# Patient Record
Sex: Male | Born: 1960 | Race: Black or African American | Hispanic: No | Marital: Single | Smoking: Former smoker
Health system: Southern US, Community
[De-identification: ages and names within clinical notes are randomized; demographics above are authoritative.]

## PROBLEM LIST (undated history)

## (undated) ENCOUNTER — Emergency Department (HOSPITAL_BASED_OUTPATIENT_CLINIC_OR_DEPARTMENT_OTHER): Admission: EM | Payer: Self-pay | Source: Home / Self Care

## (undated) DIAGNOSIS — F1491 Cocaine use, unspecified, in remission: Secondary | ICD-10-CM

## (undated) DIAGNOSIS — K08109 Complete loss of teeth, unspecified cause, unspecified class: Secondary | ICD-10-CM

## (undated) DIAGNOSIS — N401 Enlarged prostate with lower urinary tract symptoms: Secondary | ICD-10-CM

## (undated) DIAGNOSIS — C61 Malignant neoplasm of prostate: Secondary | ICD-10-CM

## (undated) DIAGNOSIS — Z87898 Personal history of other specified conditions: Secondary | ICD-10-CM

## (undated) DIAGNOSIS — I10 Essential (primary) hypertension: Secondary | ICD-10-CM

## (undated) HISTORY — PX: SATURATION BIOPSY OF PROSTATE: SHX2375

## (undated) HISTORY — PX: VASECTOMY: SHX75

## (undated) HISTORY — PX: TONSILLECTOMY: SUR1361

---

## 2018-04-27 ENCOUNTER — Ambulatory Visit (INDEPENDENT_AMBULATORY_CARE_PROVIDER_SITE_OTHER): Payer: Medicare HMO

## 2018-04-27 ENCOUNTER — Ambulatory Visit (HOSPITAL_COMMUNITY)
Admission: EM | Admit: 2018-04-27 | Discharge: 2018-04-27 | Disposition: A | Discharge status: Home / Self Care | Payer: Medicare HMO | Attending: Physician Assistant | Admitting: Physician Assistant

## 2018-04-27 ENCOUNTER — Encounter (HOSPITAL_COMMUNITY): Payer: Self-pay | Admitting: Emergency Medicine

## 2018-04-27 DIAGNOSIS — R0789 Other chest pain: Secondary | ICD-10-CM

## 2018-04-27 DIAGNOSIS — R0781 Pleurodynia: Secondary | ICD-10-CM | POA: Diagnosis not present

## 2018-04-27 HISTORY — DX: Essential (primary) hypertension: I10

## 2018-04-27 MED ORDER — IBUPROFEN 600 MG PO TABS: 600.0000 mg | ORAL_TABLET | Freq: Four times a day (QID) | ORAL | 0 refills | Status: DC | PRN

## 2018-04-27 NOTE — ED Provider Notes (Signed)
[facility]    CSN: [id number] Arrival date & time: 04/27/18  1127     History   Chief Complaint Chief Complaint  Patient presents with  . Rib Injury    HPI Jeffrey Santiago is a 57 y.o. male.   HPI  Jeffrey Santiago is a 57 y.o. male presenting to UC with c/o Left rib pain that started this morning after call onto his tub. Pain is sharp and sore, worse with certain movements and deep breathing. He has taken Norco, which is prescribed for his back, but only mild relief. Denies hitting his head or LOC.    Past Medical History:  Diagnosis Date  . Hypertension     There are no active problems to display for this patient.   Past Surgical History:  Procedure Laterality Date  . TONSILLECTOMY    . VASECTOMY         Home Medications    Prior to Admission medications   Medication Sig Start Date End Date Taking? Authorizing Provider  ibuprofen (ADVIL,MOTRIN) 600 MG tablet Take 1 tablet (600 mg total) by mouth every 6 (six) hours as needed. 04/27/18   Ok Edwards, PA-C    Family History Family History  Problem Relation Age of Onset  . Diabetes Mother     Social History Social History   Tobacco Use  . Smoking status: Current Some Day Smoker  Substance Use Topics  . Alcohol use: Not Currently  . Drug use: Never     Allergies   Patient has no known allergies.   Review of Systems Review of Systems  Respiratory: Negative for chest tightness and shortness of breath.   Cardiovascular: Positive for chest pain (Left rib). Negative for palpitations.     Physical Exam Triage Vital Signs ED Triage Vitals [04/27/18 1216]  Enc Vitals Group     BP (!) 155/97     Pulse Rate 72     Resp 16     Temp 98.3 F (36.8 C)     Temp src      SpO2 100 %     Weight      Height      Head Circumference      Peak Flow      Pain Score 8     Pain Loc      Pain Edu?      Excl. in [place]?    No data found.  Updated Vital Signs BP (!) 155/97   Pulse 72   Temp 98.3  F (36.8 C)   Resp 16   SpO2 100%   Visual Acuity Right Eye Distance:   Left Eye Distance:   Bilateral Distance:    Right Eye Near:   Left Eye Near:    Bilateral Near:     Physical Exam  Constitutional: He is oriented to person, place, and time. He appears well-developed and well-nourished.  HENT:  Head: Normocephalic and atraumatic.  Mouth/Throat: Oropharynx is clear and moist.  Eyes: EOM are normal.  Neck: Normal range of motion.  Cardiovascular: Normal rate and regular rhythm.  Pulmonary/Chest: Effort normal and breath sounds normal. No stridor. No respiratory distress. He has no wheezes. He has no rales. He exhibits tenderness.  Musculoskeletal: Normal range of motion.  Neurological: He is alert and oriented to person, place, and time.  Skin: Skin is warm and dry.  Psychiatric: He has a normal mood and affect. His behavior is normal.  Nursing note and vitals reviewed.  UC Treatments / Results  Labs (all labs ordered are listed, but only abnormal results are displayed) Labs Reviewed - No data to display  EKG None  Radiology Dg Ribs Unilateral W/chest Left  Result Date: 04/27/2018 CLINICAL DATA:  Fall in bathtub this morning. Left rib and chest pain. Initial encounter. EXAM: LEFT RIBS AND CHEST - 3+ VIEW COMPARISON:  None. FINDINGS: No fracture or other bone lesions are seen involving the ribs. There is no evidence of pneumothorax or hemothorax. Linear opacities in both lung bases may be due to mild scarring or atelectasis. No evidence of pulmonary airspace disease. Heart size and mediastinal contours are within normal limits. IMPRESSION: No left rib fractures identified. Mild bibasilar atelectasis versus scarring. Electronically Signed   By: Earle Gell M.D.   On: 04/27/2018 13:02    Procedures Procedures (including critical care time)  Medications Ordered in UC Medications - No data to display  Initial Impression / Assessment and Plan / UC Course  I have  reviewed the triage vital signs and the nursing notes.  Pertinent labs & imaging results that were available during my care of the patient were reviewed by me and considered in my medical decision making (see chart for details).      Reassured pt normal CXR May take ibuprofen with his previously prescribed norco.    Final Clinical Impressions(s) / UC Diagnoses   Final diagnoses:  Left-sided chest wall pain     Discharge Instructions      You may take 500mg  acetaminophen every 4-6 hours or in combination with ibuprofen 400-600mg  every 6-8 hours as needed for pain and inflammation.   You may also alternate cool and warm compresses for comfort.   Please follow up with your family doctor in 1 week if not improving.     ED Prescriptions    Medication Sig Dispense Auth. Provider   ibuprofen (ADVIL,MOTRIN) 600 MG tablet Take 1 tablet (600 mg total) by mouth every 6 (six) hours as needed. 30 tablet Ok Edwards, PA-C     Controlled Substance Prescriptions [place] Controlled Substance Registry consulted? Not Applicable   Tyrell Antonio 04/27/18 1713

## 2018-04-27 NOTE — ED Triage Notes (Signed)
Pt states he hit his L rib cage on the side of the tub this morning. C/o L rib cage pain and tenderness.

## 2018-04-27 NOTE — Discharge Instructions (Addendum)
°  You may take 500mg  acetaminophen every 4-6 hours or in combination with ibuprofen 400-600mg  every 6-8 hours as needed for pain and inflammation.   You may also alternate cool and warm compresses for comfort.   Please follow up with your family doctor in 1 week if not improving.

## 2019-04-28 ENCOUNTER — Other Ambulatory Visit: Payer: Self-pay

## 2019-04-28 DIAGNOSIS — Z20822 Contact with and (suspected) exposure to covid-19: Secondary | ICD-10-CM

## 2019-04-29 LAB — NOVEL CORONAVIRUS, NAA: SARS-CoV-2, NAA: NOT DETECTED

## 2019-06-26 DIAGNOSIS — R3129 Other microscopic hematuria: Secondary | ICD-10-CM | POA: Insufficient documentation

## 2019-06-26 DIAGNOSIS — R972 Elevated prostate specific antigen [PSA]: Secondary | ICD-10-CM | POA: Insufficient documentation

## 2019-08-07 DIAGNOSIS — C61 Malignant neoplasm of prostate: Secondary | ICD-10-CM | POA: Insufficient documentation

## 2019-08-17 ENCOUNTER — Encounter: Payer: Self-pay | Admitting: *Deleted

## 2019-08-21 ENCOUNTER — Encounter: Payer: Self-pay | Admitting: *Deleted

## 2019-08-24 DIAGNOSIS — N529 Male erectile dysfunction, unspecified: Secondary | ICD-10-CM | POA: Insufficient documentation

## 2019-08-31 NOTE — Progress Notes (Signed)
GU Location of Tumor / Histology: prostatic adenocarcinoma  If Prostate Cancer, Gleason Score is (3 + 4) and PSA is (6.78). Prostate volume: 36 cc  Jeffrey Santiago was noted to have an elevated PSA during his insurance physical months ago without notice.  Biopsies of prostate (if applicable) revealed:        Past/Anticipated interventions by urology, if any: prostate biopsy, ADT, encouraged to stop testosterone ssupplementation, referral for consideration of radiotherapy closer to home.  Past/Anticipated interventions by medical oncology, if any: no  Weight changes, if any: denies  Bowel/Bladder complaints, if any: IPSS 1. SHIM 25. Nocturia x 1. Taking Flomax. Denies any bowel complaints.  Nausea/Vomiting, if any: denies  Pain issues, if any:  denies  SAFETY ISSUES:  Prior radiation? no  Pacemaker/ICD? no  Possible current pregnancy? no, male patient  Is the patient on methotrexate? no  Current Complaints / other details:  59 year old male. Divorced. Father with hx of prostate and colon cancer. Reports he quit smoking but can't remember when or how many ppd he smoked.

## 2019-09-01 ENCOUNTER — Ambulatory Visit
Admission: RE | Admit: 2019-09-01 | Discharge: 2019-09-01 | Disposition: A | Discharge status: Home / Self Care | Payer: Medicare HMO | Source: Ambulatory Visit | Attending: Radiation Oncology | Admitting: Radiation Oncology

## 2019-09-01 ENCOUNTER — Encounter: Payer: Self-pay | Admitting: Radiation Oncology

## 2019-09-01 ENCOUNTER — Other Ambulatory Visit: Payer: Self-pay

## 2019-09-01 VITALS — Ht 71.0 in | Wt 225.0 lb

## 2019-09-01 DIAGNOSIS — C61 Malignant neoplasm of prostate: Secondary | ICD-10-CM

## 2019-09-01 HISTORY — DX: Malignant neoplasm of prostate: C61

## 2019-09-01 NOTE — Progress Notes (Signed)
See progress notes under physician encounter. 

## 2019-09-01 NOTE — Progress Notes (Signed)
Radiation Oncology         [phone]) [phone] ________________________________  Initial Outpatient Consultation - Conducted via Telephone due to current COVID-19 concerns for limiting patient exposure  Name: Mavryk Pino MRN: [id number]  Date: 09/01/2019  DOB: 01/07/1961  QM:VHQIONGE, [name], FNP  Thea Silversmith, MD   REFERRING PHYSICIAN: Thea Silversmith, MD  DIAGNOSIS: 59 y.o. gentleman with Stage T1c adenocarcinoma of the prostate with Gleason score of 3+4, and PSA of 10.17.    ICD-10-CM   1. Malignant neoplasm of prostate ([place])  C61     HISTORY OF PRESENT ILLNESS: Salvator Sturdivant is a 59 y.o. male with a diagnosis of prostate cancer. He was noted to have an elevated PSA of 6.78 in 07/2018 by his primary care physician, Dr. Carney Bern.  Accordingly, he was referred for evaluation in urology by Dr. Venia Minks on 03/19/2019,  digital rectal examination was performed at that time revealing no nodules.  Repeat PSA performed that day was 10.17.  A 4K score was obtained and showed an 11%, or moderate risk for aggressive prostate cancer.  The patient proceeded to transrectal ultrasound with 12 biopsies of the prostate under anesthesia on 07/22/2019.  The prostate volume measured 36 cc.  Out of 12 core biopsies, all 12 were positive.  The maximum Gleason score was 3+4, and this was seen in all 6 of the left lobe biopsies as well as in the right base, right lateral apex, right mid lateral and right base lateral.  Additionally, Gleason 3+3 was seen in the right mid and right apex with perineural invasion noted in 8 of the cores.   He met with Dr. Pablo Ledger on 08/07/2019 to discuss radiation therapy, and with Dr. Nevada Crane on 08/12/2019 to discuss prostatectomy. He was also referred for genetic counseling with [facility] on 08/25/2019.  He is adamantly not interested in proceeding with surgical prostatectomy and indicated that he was most interested in proceeding with ST-ADT concurrent with a seed boost followed by 5 weeks of daily  radiotherapy. He received a 6 month leuprolide (Lupron) injection on 08/24/2019.  After meeting with Dr. Pablo Ledger, he indicated that he would prefer to have his radiation treatments in [place], closer to his home, and he has kindly been referred today for discussion of potential radiation treatment options locally.   PREVIOUS RADIATION THERAPY: No  PAST MEDICAL HISTORY:  Past Medical History:  Diagnosis Date  . Hypertension   . Prostate cancer ([place])       PAST SURGICAL HISTORY: Past Surgical History:  Procedure Laterality Date  . TONSILLECTOMY    . VASECTOMY      FAMILY HISTORY:  Family History  Problem Relation Age of Onset  . Diabetes Mother   . Prostate cancer Father   . Colon cancer Father   . Breast cancer Neg Hx   . Pancreatic cancer Neg Hx     SOCIAL HISTORY:  Social History   Socioeconomic History  . Marital status: Single    Spouse name: Not on file  . Number of children: Not on file  . Years of education: Not on file  . Highest education level: Not on file  Occupational History  . Not on file  Tobacco Use  . Smoking status: Former Smoker    Types: Cigarettes  . Smokeless tobacco: Never Used  . Tobacco comment: Patient not interested in discussing. Reports he can't remember when he stopped smoking nor how many packs per day.  Substance and Sexual Activity  . Alcohol use: Not Currently  .  Drug use: Never  . Sexual activity: Yes  Other Topics Concern  . Not on file  Social History Narrative  . Not on file   Social Determinants of Health   Financial Resource Strain:   . Difficulty of Paying Living Expenses:   Food Insecurity:   . Worried About Charity fundraiser in the Last Year:   . Arboriculturist in the Last Year:   Transportation Needs:   . Film/video editor (Medical):   Marland Kitchen Lack of Transportation (Non-Medical):   Physical Activity:   . Days of Exercise per Week:   . Minutes of Exercise per Session:   Stress:   . Feeling of Stress  :   Social Connections:   . Frequency of Communication with Friends and Family:   . Frequency of Social Gatherings with Friends and Family:   . Attends Religious Services:   . Active Member of Clubs or Organizations:   . Attends Archivist Meetings:   Marland Kitchen Marital Status:   Intimate Partner Violence:   . Fear of Current or Ex-Partner:   . Emotionally Abused:   Marland Kitchen Physically Abused:   . Sexually Abused:     ALLERGIES: Patient has no known allergies.  MEDICATIONS:  Current Outpatient Medications  Medication Sig Dispense Refill  . amLODipine (NORVASC) 10 MG tablet Take by mouth.    . olmesartan (BENICAR) 40 MG tablet TAKE 1 TABLET BY MOUTH DAILY    . sildenafil (VIAGRA) 100 MG tablet Take 1/2 to 1 tablet as needed.  Do not use if taking nitrates for heart disease.    . tamsulosin (FLOMAX) 0.4 MG CAPS capsule TAKE 1 CAPSULE BY MOUTH DAILY     No current facility-administered medications for this encounter.    REVIEW OF SYSTEMS:  On review of systems, the patient reports that he is doing well overall. He denies any chest pain, shortness of breath, cough, fevers, chills, night sweats, unintended weight changes. He denies any bowel disturbances, and denies abdominal pain, nausea or vomiting. He denies any new musculoskeletal or joint aches or pains. His IPSS was 1, indicating minimal urinary symptoms. He endorses taking flomax and only reports nocturia x1. His SHIM was 25, indicating he does not have erectile dysfunction. A complete review of systems is obtained and is otherwise negative.    PHYSICAL EXAM:  Wt Readings from Last 3 Encounters:  09/01/19 225 lb (102.1 kg)   Temp Readings from Last 3 Encounters:  04/27/18 98.3 F (36.8 C)   BP Readings from Last 3 Encounters:  04/27/18 (!) 155/97   Pulse Readings from Last 3 Encounters:  04/27/18 72   Pain Assessment Pain Score: 0-No pain/10  Physical exam not performed in light of telephone consult visit format.   KPS  = 90  100 - Normal; no complaints; no evidence of disease. 90   - Able to carry on normal activity; minor signs or symptoms of disease. 80   - Normal activity with effort; some signs or symptoms of disease. 17   - Cares for self; unable to carry on normal activity or to do active work. 60   - Requires occasional assistance, but is able to care for most of his personal needs. 50   - Requires considerable assistance and frequent medical care. 18   - Disabled; requires special care and assistance. 84   - Severely disabled; hospital admission is indicated although death not imminent. 68   - Very sick; hospital admission necessary;  active supportive treatment necessary. 10   - Moribund; fatal processes progressing rapidly. 0     - Dead  Karnofsky DA, Abelmann WH, Craver LS and Burchenal JH [phone]) The use of the nitrogen mustards in the palliative treatment of carcinoma: with particular reference to bronchogenic carcinoma Cancer 1 634-56  LABORATORY DATA:  No results found for: WBC, HGB, HCT, MCV, PLT No results found for: NA, K, CL, CO2 No results found for: ALT, AST, GGT, ALKPHOS, BILITOT   RADIOGRAPHY: No results found.    IMPRESSION/PLAN: This visit was conducted via Telephone to spare the patient unnecessary potential exposure in the healthcare setting during the current COVID-19 pandemic. 1. 59 y.o. gentleman with Stage T1c adenocarcinoma of the prostate with Gleason score of 3+4, and PSA of 10.17.  We discussed the patient's workup and outlined the nature of prostate cancer in this setting. The patient's T stage, Gleason's score, and PSA put him into the intermediate risk group. Accordingly, he is eligible for a variety of potential treatment options including prostatectomy or ST-ADT in combination with either 8 weeks of external radiation or 5 weeks of external radiation preceded by a brachytherapy boost. We discussed the available radiation techniques, and focused on the details and  logistics of delivery. We discussed and outlined the risks, benefits, short and long-term effects associated with radiotherapy and compared and contrasted these with prostatectomy. We discussed the role of SpaceOAR gel in reducing the rectal toxicity associated with radiotherapy.  We also detailed the role of ADT in the treatment of high volume intermediate risk prostate cancer and outlined the associated side effects that could be expected with this therapy.The patient was encouraged to ask questions that were answered to his stated satisfaction.  At the end of the conversation, the patient is interested in moving forward with brachytherapy boost and use of SpaceOAR gel followed by a 5 week course of daily radiotherapy, concurrent with ST-ADT which he is already started. He received his first Lupron injection on 08/24/2019. We will share our discussion with Dr. Pablo Ledger and with Dr. Venia Minks and move forward with treatment planning accordingly.  The patient prefers to establish care with a local urologist here in [place] and keep all of his cancer care here locally.  Therefore, we will make a referral to Dr. Junious Silk for consultation and further discussion regarding treatment options.  Pending he is in agreement with the proposed plan, we will move forward with scheduling his CT [facility] planning appointment for early June 2021, in anticipation of starting his radiation treatment with seed implant approximately 2 months after the start of ADT.  He will be contacted by Romie Jumper in our office who will be working closely with him to coordinate OR scheduling and pre and post procedure appointments.  We will contact the pharmaceutical rep to ensure that [place] is available at the time of procedure.  He will have a prostate MRI following his post-seed CT SIM to confirm appropriate distribution of the [place].  He appears to have a good understanding of his disease and our treatment recommendations which are of  curative intent and he is in agreement with the stated plan.  He knows that he is welcome to call at anytime with any further questions or concerns in the interim.  Given current concerns for patient exposure during the COVID-19 pandemic, this encounter was conducted via telephone. The patient was notified in advance and was offered a MyChart meeting to allow for face to face communication but unfortunately reported  that he did not have the appropriate resources/technology to support such a visit and instead preferred to proceed with telephone consult. The patient has given verbal consent for this type of encounter. The time spent during this encounter was 60 minutes. The attendants for this meeting include Tyler Pita MD, Ashlyn Bruning PA-C, Katie Corona, and patient, Divine Hansley and his sister. During the encounter, Tyler Pita MD, Ashlyn Bruning PA-C, and scribe, Wilburn Mylar were located at [place].  Patient, Safwan Weckerly and his sister were located at home.    Nicholos Johns, PA-C    Tyler Pita, MD  [facility] Direct Dial: [phone]  Fax: [phone] [email or web address]  Skype  [email or web address]  This document serves as a record of services personally performed by Tyler Pita, MD and Freeman Caldron, PA-C. It was created on their behalf by Wilburn Mylar, a trained medical scribe. The creation of this record is based on the scribe's personal observations and the provider's statements to them. This document has been checked and approved by the attending provider.

## 2019-09-02 ENCOUNTER — Telehealth: Payer: Self-pay | Admitting: *Deleted

## 2019-09-02 NOTE — Telephone Encounter (Signed)
CALLED PATIENT TO INFORM OF [place] VISIT WITH DR. Junious Silk ON 10-26-19 - ARRIVAL TIME- 2:45 PM, LVM FOR A RETURN CALL

## 2019-09-04 ENCOUNTER — Encounter: Payer: Self-pay | Admitting: Medical Oncology

## 2019-09-04 NOTE — Progress Notes (Signed)
Spoke with patient to introduce myself as the prostate nurse navigator and discuss my role. He was seen in [facility] but has decided to move his care to [place]. He states the consult went well with Dr. Tammi Klippel. He has chosen ADT, seed boost and radiation. He received his ADT 08/24/19 and has not experienced any side effects. We discussed the purpose and side effects of ADT. He has an appointment with Dr. Junious Silk 6/7 to establish care. I gave him my contact information and asked him to call me with questions or concerns. He voiced understanding.

## 2019-09-11 ENCOUNTER — Encounter: Payer: Self-pay | Admitting: Medical Oncology

## 2019-09-11 NOTE — Progress Notes (Signed)
Dr. Junious Silk will be out of office 6/7. Appointment rescheduled to 6/1@ 1 pm. I called patient with this new appointment and he voiced understanding.

## 2019-11-03 ENCOUNTER — Telehealth: Payer: Self-pay | Admitting: *Deleted

## 2019-11-03 ENCOUNTER — Other Ambulatory Visit: Payer: Self-pay | Admitting: Urology

## 2019-11-03 NOTE — Telephone Encounter (Signed)
CALLED PATIENT TO INFORM OF PRE-SEED APPTS. FOR 12-03-19, LVM FOR A RETURN CALL

## 2019-11-26 ENCOUNTER — Telehealth: Payer: Self-pay | Admitting: *Deleted

## 2019-11-26 NOTE — Telephone Encounter (Signed)
Called patient to remind of pre-seed appts. for 12-03-19, spoke with patient and he is aware of these appts.

## 2019-12-02 ENCOUNTER — Telehealth: Payer: Self-pay | Admitting: *Deleted

## 2019-12-02 NOTE — Telephone Encounter (Signed)
CALLED PATIENT TO REMIND OF PRE-SEED APPTS. FOR 12-03-19, SPOKE WITH PATIENT AND HE IS AWARE OF THESE APPTS.

## 2019-12-03 ENCOUNTER — Other Ambulatory Visit: Payer: Self-pay

## 2019-12-03 ENCOUNTER — Ambulatory Visit
Admission: RE | Admit: 2019-12-03 | Discharge: 2019-12-03 | Disposition: A | Discharge status: Home / Self Care | Payer: Medicare HMO | Source: Ambulatory Visit | Attending: Radiation Oncology | Admitting: Radiation Oncology

## 2019-12-03 ENCOUNTER — Encounter: Payer: Self-pay | Admitting: Medical Oncology

## 2019-12-03 ENCOUNTER — Encounter (HOSPITAL_COMMUNITY)
Admission: RE | Admit: 2019-12-03 | Discharge: 2019-12-03 | Disposition: A | Discharge status: Home / Self Care | Payer: Medicare HMO | Source: Ambulatory Visit | Attending: Urology | Admitting: Urology

## 2019-12-03 ENCOUNTER — Ambulatory Visit (HOSPITAL_COMMUNITY)
Admission: RE | Admit: 2019-12-03 | Discharge: 2019-12-03 | Disposition: A | Discharge status: Home / Self Care | Payer: Medicare HMO | Source: Ambulatory Visit | Attending: Urology | Admitting: Urology

## 2019-12-03 ENCOUNTER — Ambulatory Visit
Admission: RE | Admit: 2019-12-03 | Discharge: 2019-12-03 | Disposition: A | Discharge status: Home / Self Care | Payer: Medicare HMO | Source: Ambulatory Visit | Attending: Urology | Admitting: Urology

## 2019-12-03 DIAGNOSIS — C61 Malignant neoplasm of prostate: Secondary | ICD-10-CM | POA: Insufficient documentation

## 2019-12-03 NOTE — Progress Notes (Signed)
  Radiation Oncology         [phone] ________________________________  Name: Jeffrey Santiago MRN: [id number]  Date: 12/03/2019  DOB: [birth year]  SIMULATION AND TREATMENT PLANNING NOTE PUBIC ARCH STUDY  NT:ZGYFVCBS, [name], FNP  Karie Mainland, FNP  DIAGNOSIS: 59 y.o. gentleman with Stage T1c adenocarcinoma of the prostate with Gleason score of 3+4, and PSA of 10.17.  Oncology History  PC (prostate cancer) ([place])  07/22/2019 Cancer Staging   Staging form: Prostate, AJCC 8th Edition - Clinical stage from 07/22/2019: Stage IIB (cT1c, cN0, cM0, PSA: 10.2, Grade Group: 2) - Signed by Freeman Caldron, PA-C on 09/01/2019   08/07/2019 Initial Diagnosis   PC (prostate cancer) ([place])       ICD-10-CM   1. PC (prostate cancer) ([place])  C61     COMPLEX SIMULATION:  The patient presented today for evaluation for possible prostate seed implant. He was brought to the radiation planning suite and placed supine on the CT couch. A 3-dimensional image study set was obtained in upload to the planning computer. There, on each axial slice, I contoured the prostate gland. Then, using three-dimensional radiation planning tools I reconstructed the prostate in view of the structures from the transperineal needle pathway to assess for possible pubic arch interference. In doing so, I did not appreciate any pubic arch interference. Also, the patient's prostate volume was estimated based on the drawn structure. The volume was 36 cc.  Given the pubic arch appearance and prostate volume, patient remains a good candidate to proceed with prostate seed implant. Today, he freely provided informed written consent to proceed.    PLAN: The patient will undergo upfront prostate seed boost to be followed by a 5 week course of prostate external beam radiation.   ________________________________  Sheral Apley Tammi Klippel, M.D.

## 2019-12-04 ENCOUNTER — Telehealth: Payer: Self-pay | Admitting: *Deleted

## 2019-12-04 NOTE — Progress Notes (Signed)
Abnormal CXR result ,done 12-03-2019, routed to dr eskridge in epic.

## 2019-12-04 NOTE — Telephone Encounter (Signed)
Called patient to inform of implant being moved to 02-02-20 @ 1:30 pm, lvm for a return call

## 2019-12-07 ENCOUNTER — Telehealth: Payer: Self-pay | Admitting: *Deleted

## 2019-12-07 NOTE — Telephone Encounter (Signed)
Called patient to inform that implant has been moved to 02-02-20 @ 1:30 pm , spoke with patient and he is aware of this new date

## 2019-12-24 ENCOUNTER — Encounter: Payer: Self-pay | Admitting: Medical Oncology

## 2019-12-24 NOTE — Progress Notes (Signed)
Patient called stating his brother said someone called and moved his seed implant to December. He is upset and does not understanding what is going on. I told him, his implant is scheduled for 9/14, but I will speak with Enid Derry in rad-onc to follow up and I will call him back. He also shared, that something showed on his chest x-ray but not sure what. I explained the radiologist sees a questionable lung nodule but needs to get a CT to further evaluate. Since Dr. Junious Silk is the ordering physician, he will order the CT. He voiced understanding of the above and will reach out to me if he has further questions or concerns.

## 2019-12-24 NOTE — Progress Notes (Signed)
Returned call to patient to let him know his seed implant surgery is 9/14. It has not been changed. He was very appreciative of the confirmation. I asked him to call me with questions or concerns.

## 2020-01-27 ENCOUNTER — Telehealth: Payer: Self-pay | Admitting: *Deleted

## 2020-01-27 NOTE — Telephone Encounter (Signed)
CALLED PATIENT TO REMIND OF LAB AND COVID TESTING FOR 01-29-20, SPOKE WITH PATIENT AND HE IS AWARE OF THESE APPTS.

## 2020-01-29 ENCOUNTER — Telehealth: Payer: Self-pay | Admitting: *Deleted

## 2020-01-29 ENCOUNTER — Encounter (HOSPITAL_BASED_OUTPATIENT_CLINIC_OR_DEPARTMENT_OTHER): Payer: Self-pay | Admitting: Urology

## 2020-01-29 ENCOUNTER — Encounter (HOSPITAL_COMMUNITY)
Admission: RE | Admit: 2020-01-29 | Discharge: 2020-01-29 | Disposition: A | Discharge status: Home / Self Care | Payer: Medicare HMO | Source: Ambulatory Visit | Attending: Urology | Admitting: Urology

## 2020-01-29 ENCOUNTER — Other Ambulatory Visit (HOSPITAL_COMMUNITY)
Admission: RE | Admit: 2020-01-29 | Discharge: 2020-01-29 | Disposition: A | Discharge status: Home / Self Care | Payer: Medicare HMO | Source: Ambulatory Visit | Attending: Urology | Admitting: Urology

## 2020-01-29 ENCOUNTER — Other Ambulatory Visit: Payer: Self-pay

## 2020-01-29 DIAGNOSIS — Z01812 Encounter for preprocedural laboratory examination: Secondary | ICD-10-CM | POA: Diagnosis present

## 2020-01-29 DIAGNOSIS — Z20822 Contact with and (suspected) exposure to covid-19: Secondary | ICD-10-CM | POA: Diagnosis not present

## 2020-01-29 LAB — CBC
HCT: 35.7 % — ABNORMAL LOW (ref 39.0–52.0)
Hemoglobin: 11.9 g/dL — ABNORMAL LOW (ref 13.0–17.0)
MCH: 26.5 pg (ref 26.0–34.0)
MCHC: 33.3 g/dL (ref 30.0–36.0)
MCV: 79.5 fL — ABNORMAL LOW (ref 80.0–100.0)
Platelets: 277 10*3/uL (ref 150–400)
RBC: 4.49 MIL/uL (ref 4.22–5.81)
RDW: 13.8 % (ref 11.5–15.5)
WBC: 6.2 10*3/uL (ref 4.0–10.5)
nRBC: 0 % (ref 0.0–0.2)

## 2020-01-29 LAB — COMPREHENSIVE METABOLIC PANEL
ALT: 36 U/L (ref 0–44)
AST: 25 U/L (ref 15–41)
Albumin: 4.1 g/dL (ref 3.5–5.0)
Alkaline Phosphatase: 113 U/L (ref 38–126)
Anion gap: 12 (ref 5–15)
BUN: 10 mg/dL (ref 8–23)
CO2: 28 mmol/L (ref 22–32)
Calcium: 9.7 mg/dL (ref 8.9–10.3)
Chloride: 104 mmol/L (ref 98–111)
Creatinine, Ser: 0.82 mg/dL (ref 0.61–1.24)
GFR calc Af Amer: >60 mL/min (ref >60)
GFR calc non Af Amer: >60 mL/min (ref >60)
Glucose, Bld: 114 mg/dL — ABNORMAL HIGH (ref 70–99)
Potassium: 3.5 mmol/L (ref 3.5–5.1)
Sodium: 144 mmol/L (ref 135–145)
Total Bilirubin: 0.5 mg/dL (ref 0.3–1.2)
Total Protein: 7.5 g/dL (ref 6.5–8.1)

## 2020-01-29 LAB — PROTIME-INR
INR: 1 (ref 0.8–1.2)
Prothrombin Time: 12.7 seconds (ref 11.4–15.2)

## 2020-01-29 LAB — APTT: aPTT: 27 seconds (ref 24–36)

## 2020-01-29 LAB — SARS CORONAVIRUS 2 (TAT 6-24 HRS): SARS Coronavirus 2: NEGATIVE

## 2020-01-29 NOTE — Telephone Encounter (Signed)
CALLED PATIENT TO REMIND OF PROCEDURE FOR 02-02-20, SPOKE WITH PATIENT AND HE IS AWARE OF THIS PROCEDURE

## 2020-01-29 NOTE — Progress Notes (Signed)
Spoke w/ via phone for pre-op interview--- PT Lab needs dos---- no              Lab results------ pt had CBC, CMP, PT/PTT done today results in epic;  Current ekg/cxr in epic/ chart COVID test ------ 01-29-2020 @ 0930 Arrive at ------- 1130 NPO after MN NO Solid Food.  Clear liquids from MN until--- 1030 Medications to take morning of surgery ----- Norvasc Diabetic medication ----- n/a Patient Special Instructions ----- will do one fleet enema morning of surgery Pre-Op special Istructions ----- n/a Patient verbalized understanding of instructions that were given at this phone interview. Patient denies shortness of breath, chest pain, fever, cough at this phone interview.

## 2020-02-02 ENCOUNTER — Ambulatory Visit (HOSPITAL_COMMUNITY): Payer: Medicare HMO

## 2020-02-02 ENCOUNTER — Ambulatory Visit (HOSPITAL_BASED_OUTPATIENT_CLINIC_OR_DEPARTMENT_OTHER): Payer: Medicare HMO | Admitting: Anesthesiology

## 2020-02-02 ENCOUNTER — Encounter (HOSPITAL_BASED_OUTPATIENT_CLINIC_OR_DEPARTMENT_OTHER)
Admission: RE | Disposition: A | Discharge status: Home / Self Care | Payer: Self-pay | Source: Home / Self Care | Attending: Urology

## 2020-02-02 ENCOUNTER — Ambulatory Visit (HOSPITAL_BASED_OUTPATIENT_CLINIC_OR_DEPARTMENT_OTHER)
Admission: RE | Admit: 2020-02-02 | Discharge: 2020-02-02 | Disposition: A | Discharge status: Home / Self Care | Payer: Medicare HMO | Attending: Urology | Admitting: Urology

## 2020-02-02 ENCOUNTER — Encounter (HOSPITAL_BASED_OUTPATIENT_CLINIC_OR_DEPARTMENT_OTHER): Payer: Self-pay | Admitting: Urology

## 2020-02-02 ENCOUNTER — Other Ambulatory Visit: Payer: Self-pay

## 2020-02-02 DIAGNOSIS — Z87891 Personal history of nicotine dependence: Secondary | ICD-10-CM | POA: Insufficient documentation

## 2020-02-02 DIAGNOSIS — C61 Malignant neoplasm of prostate: Secondary | ICD-10-CM | POA: Diagnosis present

## 2020-02-02 DIAGNOSIS — I10 Essential (primary) hypertension: Secondary | ICD-10-CM | POA: Diagnosis not present

## 2020-02-02 DIAGNOSIS — Z79899 Other long term (current) drug therapy: Secondary | ICD-10-CM | POA: Diagnosis not present

## 2020-02-02 HISTORY — PX: RADIOACTIVE SEED IMPLANT: SHX5150

## 2020-02-02 HISTORY — DX: Benign prostatic hyperplasia with lower urinary tract symptoms: N40.1

## 2020-02-02 HISTORY — DX: Cocaine use, unspecified, in remission: F14.91

## 2020-02-02 HISTORY — DX: Complete loss of teeth, unspecified cause, unspecified class: K08.109

## 2020-02-02 HISTORY — PX: SPACE OAR INSTILLATION: SHX6769

## 2020-02-02 HISTORY — PX: CYSTOSCOPY: SHX5120

## 2020-02-02 HISTORY — DX: Personal history of other specified conditions: Z87.898

## 2020-02-02 SURGERY — INSERTION, RADIATION SOURCE, PROSTATE
Anesthesia: General | Site: Rectum

## 2020-02-02 MED ORDER — DEXAMETHASONE SODIUM PHOSPHATE 4 MG/ML IJ SOLN
INTRAMUSCULAR | Status: DC | PRN
  Administered 2020-02-02: 10 mg via INTRAVENOUS

## 2020-02-02 MED ORDER — FENTANYL CITRATE (PF) 100 MCG/2ML IJ SOLN
INTRAMUSCULAR | Status: DC | PRN
Start: 2020-02-02 — End: 2020-02-02
  Administered 2020-02-02: 25 ug via INTRAVENOUS
  Administered 2020-02-02 (×2): 50 ug via INTRAVENOUS
  Administered 2020-02-02: 25 ug via INTRAVENOUS

## 2020-02-02 MED ORDER — MIDAZOLAM HCL 2 MG/2ML IJ SOLN
INTRAMUSCULAR | Status: AC
  Filled 2020-02-02: qty 2

## 2020-02-02 MED ORDER — EPHEDRINE SULFATE-NACL 50-0.9 MG/10ML-% IV SOSY
PREFILLED_SYRINGE | INTRAVENOUS | Status: DC | PRN
  Administered 2020-02-02: 10 mg via INTRAVENOUS
  Administered 2020-02-02: 5 mg via INTRAVENOUS

## 2020-02-02 MED ORDER — PROPOFOL 10 MG/ML IV BOLUS
INTRAVENOUS | Status: DC | PRN
  Administered 2020-02-02: 40 mg via INTRAVENOUS
  Administered 2020-02-02: 200 mg via INTRAVENOUS
  Administered 2020-02-02 (×2): 30 mg via INTRAVENOUS

## 2020-02-02 MED ORDER — CELECOXIB 200 MG PO CAPS
200.0000 mg | ORAL_CAPSULE | Freq: Once | ORAL | Status: AC
  Administered 2020-02-02: 200 mg via ORAL

## 2020-02-02 MED ORDER — ACETAMINOPHEN 500 MG PO TABS
ORAL_TABLET | ORAL | Status: AC
  Filled 2020-02-02: qty 2

## 2020-02-02 MED ORDER — SODIUM CHLORIDE (PF) 0.9 % IJ SOLN
INTRAMUSCULAR | Status: DC | PRN
  Administered 2020-02-02: 3 mL

## 2020-02-02 MED ORDER — SODIUM CHLORIDE 0.9 % IR SOLN
Status: DC | PRN
  Administered 2020-02-02: 150 mL via INTRAVESICAL

## 2020-02-02 MED ORDER — FENTANYL CITRATE (PF) 100 MCG/2ML IJ SOLN
INTRAMUSCULAR | Status: AC
  Filled 2020-02-02: qty 2

## 2020-02-02 MED ORDER — LIDOCAINE 2% (20 MG/ML) 5 ML SYRINGE
INTRAMUSCULAR | Status: DC | PRN
  Administered 2020-02-02: 100 mg via INTRAVENOUS

## 2020-02-02 MED ORDER — OXYCODONE HCL 5 MG PO TABS
ORAL_TABLET | ORAL | Status: AC
  Filled 2020-02-02: qty 1

## 2020-02-02 MED ORDER — ACETAMINOPHEN 500 MG PO TABS
1000.0000 mg | ORAL_TABLET | Freq: Once | ORAL | Status: AC
  Administered 2020-02-02: 1000 mg via ORAL

## 2020-02-02 MED ORDER — LACTATED RINGERS IV SOLN: INTRAVENOUS | Status: DC

## 2020-02-02 MED ORDER — CIPROFLOXACIN IN D5W 400 MG/200ML IV SOLN
400.0000 mg | INTRAVENOUS | Status: AC
  Administered 2020-02-02: 400 mg via INTRAVENOUS

## 2020-02-02 MED ORDER — ONDANSETRON HCL 4 MG/2ML IJ SOLN
INTRAMUSCULAR | Status: DC | PRN
  Administered 2020-02-02: 4 mg via INTRAVENOUS

## 2020-02-02 MED ORDER — EPHEDRINE 5 MG/ML INJ
INTRAVENOUS | Status: AC
  Filled 2020-02-02: qty 10

## 2020-02-02 MED ORDER — CELECOXIB 200 MG PO CAPS
ORAL_CAPSULE | ORAL | Status: AC
  Filled 2020-02-02: qty 1

## 2020-02-02 MED ORDER — FENTANYL CITRATE (PF) 100 MCG/2ML IJ SOLN: 25.0000 ug | INTRAMUSCULAR | Status: DC | PRN

## 2020-02-02 MED ORDER — IOHEXOL 300 MG/ML  SOLN
INTRAMUSCULAR | Status: DC | PRN
  Administered 2020-02-02: 7 mL via URETHRAL

## 2020-02-02 MED ORDER — MIDAZOLAM HCL 5 MG/5ML IJ SOLN
INTRAMUSCULAR | Status: DC | PRN
  Administered 2020-02-02: 2 mg via INTRAVENOUS

## 2020-02-02 MED ORDER — DEXAMETHASONE SODIUM PHOSPHATE 10 MG/ML IJ SOLN
INTRAMUSCULAR | Status: AC
  Filled 2020-02-02: qty 1

## 2020-02-02 MED ORDER — SCOPOLAMINE 1 MG/3DAYS TD PT72
MEDICATED_PATCH | TRANSDERMAL | Status: AC
  Filled 2020-02-02: qty 1

## 2020-02-02 MED ORDER — PROMETHAZINE HCL 25 MG/ML IJ SOLN: 6.2500 mg | INTRAMUSCULAR | Status: DC | PRN

## 2020-02-02 MED ORDER — ONDANSETRON HCL 4 MG/2ML IJ SOLN
INTRAMUSCULAR | Status: AC
  Filled 2020-02-02: qty 2

## 2020-02-02 MED ORDER — OXYCODONE HCL 5 MG PO TABS
5.0000 mg | ORAL_TABLET | Freq: Once | ORAL | Status: AC
  Administered 2020-02-02: 5 mg via ORAL

## 2020-02-02 MED ORDER — FLEET ENEMA 7-19 GM/118ML RE ENEM: 1.0000 | ENEMA | Freq: Once | RECTAL | Status: DC

## 2020-02-02 MED ORDER — STERILE WATER FOR IRRIGATION IR SOLN
Status: DC | PRN
  Administered 2020-02-02: 3 mL

## 2020-02-02 MED ORDER — SCOPOLAMINE 1 MG/3DAYS TD PT72
1.0000 | MEDICATED_PATCH | TRANSDERMAL | Status: DC
  Administered 2020-02-02: 1.5 mg via TRANSDERMAL

## 2020-02-02 MED ORDER — LIDOCAINE 2% (20 MG/ML) 5 ML SYRINGE
INTRAMUSCULAR | Status: AC
  Filled 2020-02-02: qty 5

## 2020-02-02 MED ORDER — CIPROFLOXACIN IN D5W 400 MG/200ML IV SOLN
INTRAVENOUS | Status: AC
  Filled 2020-02-02: qty 200

## 2020-02-02 SURGICAL SUPPLY — 42 items
BAG URINE DRAIN 2000ML AR STRL (UROLOGICAL SUPPLIES) ×4 IMPLANT
BLADE CLIPPER SENSICLIP SURGIC (BLADE) ×4 IMPLANT
CATH FOLEY 2WAY SLVR  5CC 16FR (CATHETERS) ×1
CATH FOLEY 2WAY SLVR 5CC 16FR (CATHETERS) ×3 IMPLANT
CATH ROBINSON RED A/P 16FR (CATHETERS) IMPLANT
CATH ROBINSON RED A/P 20FR (CATHETERS) ×4 IMPLANT
CLOTH BEACON ORANGE TIMEOUT ST (SAFETY) ×4 IMPLANT
CNTNR URN SCR LID CUP LEK RST (MISCELLANEOUS) ×6 IMPLANT
CONT SPEC 4OZ STRL OR WHT (MISCELLANEOUS) ×2
COVER BACK TABLE 60X90IN (DRAPES) ×4 IMPLANT
COVER MAYO STAND STRL (DRAPES) ×4 IMPLANT
DRAPE C-ARM 35X43 STRL (DRAPES) ×4 IMPLANT
DRSG TEGADERM 4X4.75 (GAUZE/BANDAGES/DRESSINGS) ×4 IMPLANT
DRSG TEGADERM 8X12 (GAUZE/BANDAGES/DRESSINGS) ×8 IMPLANT
GAUZE SPONGE 4X4 12PLY STRL LF (GAUZE/BANDAGES/DRESSINGS) ×4 IMPLANT
GLOVE BIO SURGEON STRL SZ 6.5 (GLOVE) ×4 IMPLANT
GLOVE BIO SURGEON STRL SZ7 (GLOVE) ×4 IMPLANT
GLOVE BIO SURGEON STRL SZ7.5 (GLOVE) ×4 IMPLANT
GLOVE BIO SURGEON STRL SZ8 (GLOVE) IMPLANT
GLOVE BIOGEL PI IND STRL 7.0 (GLOVE) ×6 IMPLANT
GLOVE BIOGEL PI IND STRL 7.5 (GLOVE) ×3 IMPLANT
GLOVE BIOGEL PI INDICATOR 7.0 (GLOVE) ×2
GLOVE BIOGEL PI INDICATOR 7.5 (GLOVE) ×1
GLOVE SURG ORTHO 8.5 STRL (GLOVE) ×4 IMPLANT
GLOVE SURG SS PI 6.5 STRL IVOR (GLOVE) IMPLANT
GOWN STRL REUS W/TWL LRG LVL3 (GOWN DISPOSABLE) ×16 IMPLANT
HOLDER FOLEY CATH W/STRAP (MISCELLANEOUS) ×4 IMPLANT
I SEED AGX100 ×216 IMPLANT
IMPL SPACEOAR VUE SYSTEM (Spacer) ×3 IMPLANT
IMPLANT SPACEOAR VUE SYSTEM (Spacer) ×4 IMPLANT
IV NS 1000ML (IV SOLUTION) ×1
IV NS 1000ML BAXH (IV SOLUTION) ×3 IMPLANT
KIT TURNOVER CYSTO (KITS) ×4 IMPLANT
MARKER SKIN DUAL TIP RULER LAB (MISCELLANEOUS) ×4 IMPLANT
PACK CYSTO (CUSTOM PROCEDURE TRAY) ×4 IMPLANT
SURGILUBE 2OZ TUBE FLIPTOP (MISCELLANEOUS) IMPLANT
SUT BONE WAX W31G (SUTURE) IMPLANT
SYR 10ML LL (SYRINGE) ×8 IMPLANT
TOWEL OR 17X26 10 PK STRL BLUE (TOWEL DISPOSABLE) ×4 IMPLANT
TUBE CONNECTING 12X1/4 (SUCTIONS) IMPLANT
UNDERPAD 30X36 HEAVY ABSORB (UNDERPADS AND DIAPERS) ×8 IMPLANT
WATER STERILE IRR 500ML POUR (IV SOLUTION) ×4 IMPLANT

## 2020-02-02 NOTE — Anesthesia Preprocedure Evaluation (Addendum)
Anesthesia Evaluation  Patient identified by MRN, date of birth, ID band Patient awake    Reviewed: Allergy & Precautions, NPO status , Patient's Chart, lab work & pertinent test results  History of Anesthesia Complications Negative for: history of anesthetic complications  Airway Mallampati: I  TM Distance: >3 FB Neck ROM: Full    Dental  (+) Edentulous Upper, Partial Lower, Dental Advisory Given   Pulmonary former smoker,    Pulmonary exam normal        Cardiovascular hypertension, Pt. on medications Normal cardiovascular exam     Neuro/Psych negative neurological ROS     GI/Hepatic negative GI ROS, Neg liver ROS,   Endo/Other  negative endocrine ROS  Renal/GU negative Renal ROS     Musculoskeletal negative musculoskeletal ROS (+)   Abdominal   Peds  Hematology negative hematology ROS (+)   Anesthesia Other Findings   Reproductive/Obstetrics                            Anesthesia Physical Anesthesia Plan  ASA: II  Anesthesia Plan: General   Post-op Pain Management:    Induction: Intravenous  PONV Risk Score and Plan: 4 or greater and Ondansetron, Dexamethasone, Scopolamine patch - Pre-op and Midazolam  Airway Management Planned: LMA  Additional Equipment:   Intra-op Plan:   Post-operative Plan: Extubation in OR  Informed Consent: I have reviewed the patients History and Physical, chart, labs and discussed the procedure including the risks, benefits and alternatives for the proposed anesthesia with the patient or authorized representative who has indicated his/her understanding and acceptance.     Dental advisory given  Plan Discussed with: Anesthesiologist and CRNA  Anesthesia Plan Comments:        Anesthesia Quick Evaluation

## 2020-02-02 NOTE — Discharge Instructions (Signed)
Brachytherapy for Prostate Cancer, Care After ° °This sheet gives you information about how to care for yourself after your procedure. Your health care provider may also give you more specific instructions. If you have problems or questions, contact your health care provider. °What can I expect after the procedure? °After the procedure, it is common to have: °· Trouble passing urine. °· Blood in the urine or semen. °· Constipation. °· Frequent feeling of an urgent need to urinate. °· Bruising, swelling, and tenderness of the area behind the scrotum (perineum). °· Bloating and gas. °· Fatigue. °· Burning or pain in the rectum. °· Problems getting or keeping an erection (erectile dysfunction). °· Nausea. °Follow these instructions at home: °Managing pain, stiffness, and swelling °· If directed, apply ice to the affected area: °? Put ice in a plastic bag. °? Place a towel between your skin and the bag. °? Leave the ice on for 20 minutes, 2-3 times a day. °· Try not to sit directly on the area behind the scrotum. A soft cushion can help with discomfort. °Activity °· Do not drive for 24 hours if you were given a medicine to help you relax (sedative). °· Do not drive or use heavy machinery while taking prescription pain medicine. °· Rest as told by your health care provider. °· Most people can return to normal activities a few days or weeks after the procedure. Ask your health care provider what activities are safe for you. °Eating and drinking °· Drink enough fluid to keep your urine clear or pale yellow. °· Eat a healthy, balanced diet. This includes lean proteins, whole grains, and plenty of fruits and vegetables. °General instructions °· Take over-the-counter and prescription medicines only as told by your health care provider. °· Keep all follow-up visits as told by your health care provider. This is important. You may still need additional treatment. °· Do not take baths, swim, or use a hot tub until your health  care provider approves. Shower and wash the area behind the scrotum gently. °· Do not have sex for one week after the treatment, or until your health care provider approves. °· If you have permanent, low-dose brachytherapy implants: °? Limit close contact with children and pregnant women for 2 months or as told by your health care provider. This is important because of the radiation that is still active in the prostate. °? You may set off radioactive sensors, such as airport screenings. Ask your health care provider for a document that explains your treatment. °? You may be instructed to use a condom during sex for the first 2 months after low-dose brachytherapy. °Contact a health care provider if: °· You have a fever or chills. °· You do not have a bowel movement for 3-4 days after the procedure. °· You have diarrhea for 3-4 days after the procedure. °· You develop any new symptoms, such as problems with urinating or erectile dysfunction. °· You have abdomen (abdominal) pain. °· You have more blood in your urine. °Get help right away if: °· You cannot urinate. °· There is excessive bleeding from your rectum. °· You have unusual drainage coming from your rectum. °· You have severe pain in the treated area that does not go away with pain medicine. °· You have severe nausea or vomiting. °Summary °· If you have permanent, low-dose brachytherapy implants, limit close contact with children and pregnant women for 2 months or as told by your health care provider. This is important because of the radiation   that is still active in the prostate.  Talk with your health care provider about your risk of brachytherapy side effects, such as erectile dysfunction or urinary problems. Your health care provider will be able to recommend possible treatment options.  Keep all follow-up visits as told by your health care provider. This is important. You may need additional treatment. This information is not intended to replace  advice given to you by your health care provider. Make sure you discuss any questions you have with your health care provider. Document Revised: 04/19/2017 Document Reviewed: 06/08/2016  Elsevier Patient Education  [place].  No advil, aleve, motrin, ibuprofen until 6 pm   Post Anesthesia Home Care Instructions  Activity: Get plenty of rest for the remainder of the day. A responsible adult should stay with you for 24 hours following the procedure.  For the next 24 hours, DO NOT: -Drive a car -Paediatric nurse -Drink alcoholic beverages -Take any medication unless instructed by your physician -Make any legal decisions or sign important papers.  Meals: Start with liquid foods such as gelatin or soup. Progress to regular foods as tolerated. Avoid greasy, spicy, heavy foods. If nausea and/or vomiting occur, drink only clear liquids until the nausea and/or vomiting subsides. Call your physician if vomiting continues.  Special Instructions/Symptoms: Your throat may feel dry or sore from the anesthesia or the breathing tube placed in your throat during surgery. If this causes discomfort, gargle with warm salt water. The discomfort should disappear within 24 hours.  If you had a scopolamine patch placed behind your ear for the management of post- operative nausea and/or vomiting:  1. The medication in the patch is effective for 72 hours, after which it should be removed.  Wrap patch in a tissue and discard in the trash. Wash hands thoroughly with soap and water. 2. You may remove the patch earlier than 72 hours if you experience unpleasant side effects which may include dry mouth, dizziness or visual disturbances. 3. Avoid touching the patch. Wash your hands with soap and water after contact with the patch.

## 2020-02-02 NOTE — Op Note (Signed)
Preoperative diagnosis: Stage I (T1cNxMx) Prostate cancer Postoperative diagnosis: Same  Procedure: Prostate brachytherapy seed implant, SpaceOAR biodegradable gel insertion Cystoscopy  Surgeon: Junious Silk  Radiation oncologist: Tammi Klippel  Anesthesia: Gen.  Indication for procedure: 59 year old male with prostate cancer who elected to proceed with prostate brachytherapy boost prior to external beam radiation.  Findings: On fluoroscopic imaging there was adequate coverage of the prostate. On cystoscopy the urethra appeared normal, the prostatic urethra appeared normal, the trigone and ureteral orifices appeared normal with clear efflux. The bladder mucosa appeared normal. There were no stones, foreign bodies or seeds visualized in the bladder.  Dose: 110 Gy  Description of procedure: After consent was obtained patient brought to the operating room. After adequate anesthesia he is placed in lithotomy position and the transrectal ultrasound probe and perineal template positioned. Catheters and brachytherapy seeds were placed per Dr. Johny Shears plan. A total of 16 catheters and 54 active sources (I-125) were placed. The anchoring needles, template and ultrasound were removed.  The 18-gauge needle was then inserted approximately 1 to 2 cm anterior to the anal opening and directed under fluoroscopic guidance into the perirectal fat between the anterior rectal wall and the prostate capsule down to the mid-gland. Midline needle position was confirmed in the sagittal and axial views to verify the tip was in the perirectal fat.  Small amounts of saline were injected to hydrodissect the space between the prostate and the anterior rectal wall.  Axial imaging was viewed to confirm the needle was in the correct location in the mid gland and centered.  Aspiration confirmed no intravascular access.  The saline syringe was carefully disconnected maintaining the desired needle position and the hydrogel was  attached to the needle.  Under ultrasound guidance in the sagittal view a smooth continuous injection was done over about 12 seconds delivering the hydrogel into the space between the prostate and rectal wall.  The needle was withdrawn.  Scout flouro imaging was obtained of the implant. The Foley was removed.  Another image was obtained.  The patient was prepped again and cystoscopy was performed which was noted to be normal. He was awakened taken to the recovery room in stable condition.  Complications: None  Blood loss: Minimal  Specimens: None  Drains: none  Disposition: Patient stable to PACU.

## 2020-02-02 NOTE — Anesthesia Procedure Notes (Signed)
Procedure Name: LMA Insertion Date/Time: 02/02/2020 1:45 PM Performed by: Eulas Post, Lutricia Widjaja W, CRNA Pre-anesthesia Checklist: Patient identified, Emergency Drugs available, Suction available and Patient being monitored Patient Re-evaluated:Patient Re-evaluated prior to induction Oxygen Delivery Method: Circle system utilized Preoxygenation: Pre-oxygenation with 100% oxygen Induction Type: IV induction Ventilation: Mask ventilation without difficulty LMA: LMA inserted LMA Size: 5.0 Number of attempts: 1 Placement Confirmation: positive ETCO2 and breath sounds checked- equal and bilateral Tube secured with: Tape Dental Injury: Teeth and Oropharynx as per pre-operative assessment

## 2020-02-02 NOTE — Transfer of Care (Signed)
Immediate Anesthesia Transfer of Care Note  Patient: Jeffrey Santiago  Procedure(s) Performed: RADIOACTIVE SEED IMPLANT/BRACHYTHERAPY IMPLANT (N/A Prostate) SPACE OAR INSTILLATION (N/A Rectum) CYSTOSCOPY FLEXIBLE (N/A Bladder)  Patient Location: PACU  Anesthesia Type:General  Level of Consciousness: drowsy  Airway & Oxygen Therapy: Patient Spontanous Breathing and Patient connected to face mask oxygen  Post-op Assessment: Report given to RN  Post vital signs: Reviewed and stable  Last Vitals:  Vitals Value Taken Time  BP 146/86   Temp    Pulse 72 02/02/20 1459  Resp 18 02/02/20 1459  SpO2 100 % 02/02/20 1459  Vitals shown include unvalidated device data.  Last Pain:  Vitals:   02/02/20 1146  TempSrc: Oral  PainSc: 0-No pain         Complications: No complications documented.

## 2020-02-02 NOTE — Anesthesia Postprocedure Evaluation (Signed)
Anesthesia Post Note  Patient: [employer]) Performed: RADIOACTIVE SEED IMPLANT/BRACHYTHERAPY IMPLANT (N/A Prostate) SPACE OAR INSTILLATION (N/A Rectum) CYSTOSCOPY FLEXIBLE (N/A Bladder)     Patient location during evaluation: PACU Anesthesia Type: General Level of consciousness: awake and alert Pain management: pain level controlled Vital Signs Assessment: post-procedure vital signs reviewed and stable Respiratory status: spontaneous breathing, nonlabored ventilation, respiratory function stable and patient connected to nasal cannula oxygen Cardiovascular status: blood pressure returned to baseline and stable Postop Assessment: no apparent nausea or vomiting Anesthetic complications: no   No complications documented.  Last Vitals:  Vitals:   02/02/20 1530 02/02/20 1545  BP: (!) 148/82 (!) 161/82  Pulse: (!) 52 (!) 48  Resp: 15 14  Temp:    SpO2: 99% 96%    Last Pain:  Vitals:   02/02/20 1545  TempSrc:   PainSc: 6                  Raymar Joiner L Tytan Sandate

## 2020-02-02 NOTE — H&P (Signed)
H&P  Chief Complaint: Prostate cancer  History of Present Illness: Jeffrey Santiago is a 59 year old African-American male with prostate cancer.  He was seen by Dr. Nevada Crane and Dr. Venia Minks.  His PSA was 10.17 with a 36 g prostate.  Prostate biopsy revealed Gleason 3+4 = 7 and 3+3 equal 6 disease in 12 of 12 cores.  ADT was initiated August 24, 2019.  He presents today for brachytherapy seed boost, SpaceOAR biodegradable gel injection and cystoscopy.  He is well.  No fevers.  No dysuria or gross hematuria.  He is voiding with a good stream and without frequency.  Past Medical History:  Diagnosis Date  . Full dentures   . History of cocaine use    last used per pt 2015  . Hyperplasia of prostate with lower urinary tract symptoms (LUTS)   . Hypertension    followed by pcp  (01-29-2020 per pt had stress test approx. 2005, told ok)  . Prostate cancer [facility]) urologist--- dr eskrige/  oncologist--- dr Tammi Klippel   dx 03/ 2021,  Stage T1c, Gleason 3+4   Past Surgical History:  Procedure Laterality Date  . SATURATION BIOPSY OF PROSTATE  07-22-2019  @[facility]   (general anesthesia)   ultrasound guided and cystoscopy  . TONSILLECTOMY  child    Home Medications:  Medications Prior to Admission  Medication Sig Dispense Refill Last Dose  . amLODipine (NORVASC) 10 MG tablet Take by mouth.   02/01/2020 at Unknown time  . olmesartan (BENICAR) 40 MG tablet TAKE 1 TABLET BY MOUTH DAILY   02/01/2020 at Unknown time  . sildenafil (VIAGRA) 100 MG tablet Take 1/2 to 1 tablet as needed.  Do not use if taking nitrates for heart disease.   Past Week at Unknown time  . tamsulosin (FLOMAX) 0.4 MG CAPS capsule TAKE 1 CAPSULE BY MOUTH DAILY   Past Week at Unknown time  . VITAMIN D PO Take by mouth daily.   02/01/2020 at Unknown time   Allergies: No Known Allergies  Family History  Problem Relation Age of Onset  . Diabetes Mother   . Prostate cancer Father   . Colon cancer Father   . Breast cancer Neg Hx   . Pancreatic cancer Neg Hx     Social History:  reports that he quit smoking 11 days ago. His smoking use included cigarettes. He quit after 10.00 years of use. He has never used smokeless tobacco. He reports previous alcohol use. He reports previous drug use.  ROS: A complete review of systems was performed.  All systems are negative except for pertinent findings as noted. Review of Systems  All other systems reviewed and are negative.    Physical Exam:  Vital signs in last 24 hours: Temp:  [99.6 F (37.6 C)] 99.6 F (37.6 C) (09/14 1146) Pulse Rate:  [106] 106 (09/14 1146) Resp:  [18] 18 (09/14 1146) BP: (122)/(95) 122/95 (09/14 1146) SpO2:  [99 %] 99 % (09/14 1146) Weight:  [105 kg] 105 kg (09/14 1146) General:  Alert and oriented, No acute distress HEENT: Normocephalic, atraumatic Cardiovascular: Regular rate and rhythm Lungs: Regular rate and effort Abdomen: Soft, nontender, nondistended, no abdominal masses Back: No CVA tenderness Extremities: No edema Neurologic: Grossly intact  Laboratory Data:  No results found for this or any previous visit (from the past 24 hour(s)). Recent Results (from the past 240 hour(s))  SARS CORONAVIRUS 2 (TAT 6-24 HRS) Nasopharyngeal Nasopharyngeal Swab     Status: None   Collection Time: 01/29/20  9:06 AM  Specimen: Nasopharyngeal Swab  Result Value Ref Range Status   SARS Coronavirus 2 NEGATIVE NEGATIVE Final    Comment: (NOTE) SARS-CoV-2 target nucleic acids are NOT DETECTED.  The SARS-CoV-2 RNA is generally detectable in upper and lower respiratory specimens during the acute phase of infection. Negative results do not preclude SARS-CoV-2 infection, do not rule out co-infections with other pathogens, and should not be used as the sole basis for treatment or other patient management decisions. Negative results must be combined with clinical observations, patient history, and epidemiological information. The expected result is Negative.  Fact Sheet for  Patients: SugarRoll.be  Fact Sheet for Healthcare Providers: https://www.woods-mathews.com/  This test is not yet approved or cleared by the [place] FDA and  has been authorized for detection and/or diagnosis of SARS-CoV-2 by FDA under an Emergency Use Authorization (EUA). This EUA will remain  in effect (meaning this test can be used) for the duration of the COVID-19 declaration under Se ction 564(b)(1) of the Act, 21 U.S.C. section 360bbb-3(b)(1), unless the authorization is terminated or revoked sooner.  Performed at [facility], [place] [address] [place], [place] [address]    Creatinine: Recent Labs    01/29/20 0816  CREATININE 0.82    Impression/Assessment:  Prostate cancer  Plan:  I discussed with the patient the nature, potential benefits, risks and alternatives to brachytherapy with prostate seed implantation, injection of SpaceOAR biodegradable gel, and flexible cystoscopy, including side effects of the proposed treatment, the likelihood of the patient achieving the goals of the procedure, and any potential problems that might occur during the procedure or recuperation.  We discussed risk such as injury to adjacent structures such as the urethra, bladder, ureters, rectum and blood vessels or nerves among others.  We talked about the importance of follow-up so that we can monitor his urinary and bowel symptoms, urinalysis and PSA over time.  All questions answered. Patient elects to proceed. Festus Aloe 02/02/2020, 1:34 PM

## 2020-02-03 ENCOUNTER — Encounter (HOSPITAL_BASED_OUTPATIENT_CLINIC_OR_DEPARTMENT_OTHER): Payer: Self-pay | Admitting: Urology

## 2020-02-14 NOTE — Progress Notes (Signed)
  Radiation Oncology         [phone] ________________________________  Name: Jeffrey Santiago MRN: [id number]  Date: 02/14/2020  DOB: 1960-12-29       Prostate Seed Implant  BC:[id number], [name], FNP  No ref. provider found  DIAGNOSIS:  Oncology History  PC (prostate cancer) ([place])  07/22/2019 Cancer Staging   Staging form: Prostate, AJCC 8th Edition - Clinical stage from 07/22/2019: Stage IIB (cT1c, cN0, cM0, PSA: 10.2, Grade Group: 2) - Signed by Freeman Caldron, PA-C on 09/01/2019   08/07/2019 Initial Diagnosis   PC (prostate cancer) ([place])       ICD-10-CM   1. PC (prostate cancer) ([place])  C61 Discharge patient    PROCEDURE: Insertion of radioactive I-125 seeds into the prostate gland.  RADIATION DOSE: 110 Gy, boost therapy.  TECHNIQUE: [name] was brought to the operating room with the urologist. He was placed in the dorsolithotomy position. He was catheterized and a rectal tube was inserted. The perineum was shaved, prepped and draped. The ultrasound probe was then introduced into the rectum to see the prostate gland.  TREATMENT DEVICE: A needle grid was attached to the ultrasound probe stand and anchor needles were placed.  3D PLANNING: The prostate was imaged in 3D using a sagittal sweep of the prostate probe. These images were transferred to the planning computer. There, the prostate, urethra and rectum were defined on each axial reconstructed image. Then, the software created an optimized 3D plan and a few seed positions were adjusted. The quality of the plan was reviewed using [facility] information for the target and the following two organs at risk:  Urethra and Rectum.  Then the accepted plan was printed and handed off to the radiation therapist.  Under my supervision, the custom loading of the seeds and spacers was carried out and loaded into sealed vicryl sleeves.  These pre-loaded needles were then placed into the needle holder.Marland Kitchen  PROSTATE VOLUME STUDY:  Using transrectal  ultrasound the volume of the prostate was verified to be 24 cc.  SPECIAL TREATMENT PROCEDURE/SUPERVISION AND HANDLING: The pre-loaded needles were then delivered under sagittal guidance. A total of 16 needles were used to deposit 54 seeds in the prostate gland. The individual seed activity was 0.332 mCi.  SpaceOAR:  Yes  COMPLEX SIMULATION: At the end of the procedure, an anterior radiograph of the pelvis was obtained to document seed positioning and count. Cystoscopy was performed to check the urethra and bladder.  MICRODOSIMETRY: At the end of the procedure, the patient was emitting 0.04 mR/hr at 1 meter. Accordingly, he was considered safe for hospital discharge.  PLAN: The patient will return to the radiation oncology clinic for post implant CT dosimetry in three weeks.   ________________________________  Sheral Apley Tammi Klippel, M.D.

## 2020-02-23 ENCOUNTER — Telehealth: Payer: Self-pay | Admitting: *Deleted

## 2020-02-23 NOTE — Telephone Encounter (Signed)
CALLED PATIENT TO REMIND OF SIM APPT. FOR 02-24-20, PATIENT STATED THAT HE IS OUT OF TOWN, AND WILL NOT BE BACK UNTIL 03-01-20, SIM APPT. RESCHEDULED FOR 03-04-20 @ 8 AM, PATIENT VERIFIED UNDERSTANDING THIS APPT.

## 2020-02-24 ENCOUNTER — Ambulatory Visit: Payer: Medicare HMO | Admitting: Radiation Oncology

## 2020-03-03 ENCOUNTER — Telehealth: Payer: Self-pay | Admitting: *Deleted

## 2020-03-03 NOTE — Telephone Encounter (Signed)
Called patient to remind of sim appt, for 03-04-20, spoke with patient and he is aware of this appt.

## 2020-03-04 ENCOUNTER — Ambulatory Visit
Admission: RE | Admit: 2020-03-04 | Discharge: 2020-03-04 | Disposition: A | Discharge status: Home / Self Care | Payer: Medicare HMO | Source: Ambulatory Visit | Attending: Radiation Oncology | Admitting: Radiation Oncology

## 2020-03-04 ENCOUNTER — Other Ambulatory Visit: Payer: Self-pay

## 2020-03-04 ENCOUNTER — Encounter: Payer: Self-pay | Admitting: Medical Oncology

## 2020-03-04 DIAGNOSIS — C61 Malignant neoplasm of prostate: Secondary | ICD-10-CM | POA: Diagnosis present

## 2020-03-04 DIAGNOSIS — Z510 Encounter for antineoplastic radiation therapy: Secondary | ICD-10-CM | POA: Diagnosis not present

## 2020-03-04 NOTE — Progress Notes (Signed)
  Radiation Oncology         [phone] ________________________________  Name: Jeffrey Santiago MRN: [id number]  Date: 03/04/2020  DOB: 01-Sep-1960  SIMULATION AND TREATMENT PLANNING NOTE    ICD-10-CM   1. PC (prostate cancer) ([place])  C61     DIAGNOSIS:  59 y.o. gentleman with Stage T1c adenocarcinoma of the prostate with Gleason score of 3+4, and PSA of 10.17.  NARRATIVE:  The patient was brought to the [place].  Identity was confirmed.  All relevant records and images related to the planned course of therapy were reviewed.  The patient freely provided informed written consent to proceed with treatment after reviewing the details related to the planned course of therapy. The consent form was witnessed and verified by the simulation staff.  Then, the patient was set-up in a stable reproducible supine position for radiation therapy.  A vacuum lock pillow device was custom fabricated to position his legs in a reproducible immobilized position.  Then, I performed a urethrogram under sterile conditions to identify the prostatic apex.  CT images were obtained.  Surface markings were placed.  The CT images were loaded into the planning software.  Then the prostate target and avoidance structures including the rectum, bladder, bowel and hips were contoured.  Treatment planning then occurred.  The radiation prescription was entered and confirmed.  A total of one complex treatment devices were fabricated. I have requested : Intensity Modulated Radiotherapy (IMRT) is medically necessary for this case for the following reason:  Rectal sparing.Marland Kitchen  PLAN:  The patient will receive 45 Gy in 25 fractions of 1.8 Gy, to supplement an up-front prostate seed implant boost of 110 Gy to achieve a total nominal dose of 165 Gy.  ________________________________  Sheral Apley Tammi Klippel, M.D.

## 2020-03-11 DIAGNOSIS — Z510 Encounter for antineoplastic radiation therapy: Secondary | ICD-10-CM | POA: Diagnosis not present

## 2020-03-14 ENCOUNTER — Ambulatory Visit
Admission: RE | Admit: 2020-03-14 | Discharge: 2020-03-14 | Disposition: A | Discharge status: Home / Self Care | Payer: Medicare HMO | Source: Ambulatory Visit | Attending: Radiation Oncology | Admitting: Radiation Oncology

## 2020-03-14 ENCOUNTER — Encounter: Payer: Self-pay | Admitting: Radiation Oncology

## 2020-03-14 ENCOUNTER — Other Ambulatory Visit: Payer: Self-pay

## 2020-03-14 ENCOUNTER — Encounter: Payer: Self-pay | Admitting: Medical Oncology

## 2020-03-14 DIAGNOSIS — Z510 Encounter for antineoplastic radiation therapy: Secondary | ICD-10-CM | POA: Diagnosis not present

## 2020-03-15 ENCOUNTER — Ambulatory Visit
Admission: RE | Admit: 2020-03-15 | Discharge: 2020-03-15 | Disposition: A | Discharge status: Home / Self Care | Payer: Medicare HMO | Source: Ambulatory Visit | Attending: Radiation Oncology | Admitting: Radiation Oncology

## 2020-03-15 ENCOUNTER — Other Ambulatory Visit: Payer: Self-pay

## 2020-03-15 DIAGNOSIS — Z510 Encounter for antineoplastic radiation therapy: Secondary | ICD-10-CM | POA: Diagnosis not present

## 2020-03-16 ENCOUNTER — Ambulatory Visit
Admission: RE | Admit: 2020-03-16 | Discharge: 2020-03-16 | Disposition: A | Discharge status: Home / Self Care | Payer: Medicare HMO | Source: Ambulatory Visit | Attending: Radiation Oncology | Admitting: Radiation Oncology

## 2020-03-16 DIAGNOSIS — Z510 Encounter for antineoplastic radiation therapy: Secondary | ICD-10-CM | POA: Diagnosis not present

## 2020-03-17 ENCOUNTER — Ambulatory Visit
Admission: RE | Admit: 2020-03-17 | Discharge: 2020-03-17 | Disposition: A | Discharge status: Home / Self Care | Payer: Medicare HMO | Source: Ambulatory Visit | Attending: Radiation Oncology | Admitting: Radiation Oncology

## 2020-03-17 DIAGNOSIS — Z510 Encounter for antineoplastic radiation therapy: Secondary | ICD-10-CM | POA: Diagnosis not present

## 2020-03-18 ENCOUNTER — Ambulatory Visit
Admission: RE | Admit: 2020-03-18 | Discharge: 2020-03-18 | Disposition: A | Discharge status: Home / Self Care | Payer: Medicare HMO | Source: Ambulatory Visit | Attending: Radiation Oncology | Admitting: Radiation Oncology

## 2020-03-18 ENCOUNTER — Other Ambulatory Visit: Payer: Self-pay | Admitting: Radiation Oncology

## 2020-03-18 DIAGNOSIS — Z510 Encounter for antineoplastic radiation therapy: Secondary | ICD-10-CM | POA: Diagnosis not present

## 2020-03-18 MED ORDER — PROCHLORPERAZINE MALEATE 10 MG PO TABS: 10.0000 mg | ORAL_TABLET | Freq: Three times a day (TID) | ORAL | 0 refills | Status: AC | PRN

## 2020-03-21 ENCOUNTER — Ambulatory Visit
Admission: RE | Admit: 2020-03-21 | Discharge: 2020-03-21 | Disposition: A | Discharge status: Home / Self Care | Payer: Medicare HMO | Source: Ambulatory Visit | Attending: Radiation Oncology | Admitting: Radiation Oncology

## 2020-03-21 DIAGNOSIS — Z510 Encounter for antineoplastic radiation therapy: Secondary | ICD-10-CM | POA: Diagnosis present

## 2020-03-21 DIAGNOSIS — C61 Malignant neoplasm of prostate: Secondary | ICD-10-CM | POA: Diagnosis present

## 2020-03-22 ENCOUNTER — Ambulatory Visit
Admission: RE | Admit: 2020-03-22 | Discharge: 2020-03-22 | Disposition: A | Discharge status: Home / Self Care | Payer: Medicare HMO | Source: Ambulatory Visit | Attending: Radiation Oncology | Admitting: Radiation Oncology

## 2020-03-22 ENCOUNTER — Other Ambulatory Visit: Payer: Self-pay

## 2020-03-22 DIAGNOSIS — Z510 Encounter for antineoplastic radiation therapy: Secondary | ICD-10-CM | POA: Diagnosis not present

## 2020-03-23 ENCOUNTER — Ambulatory Visit
Admission: RE | Admit: 2020-03-23 | Discharge: 2020-03-23 | Disposition: A | Discharge status: Home / Self Care | Payer: Medicare HMO | Source: Ambulatory Visit | Attending: Radiation Oncology | Admitting: Radiation Oncology

## 2020-03-23 DIAGNOSIS — Z510 Encounter for antineoplastic radiation therapy: Secondary | ICD-10-CM | POA: Diagnosis not present

## 2020-03-24 ENCOUNTER — Ambulatory Visit
Admission: RE | Admit: 2020-03-24 | Discharge: 2020-03-24 | Disposition: A | Discharge status: Home / Self Care | Payer: Medicare HMO | Source: Ambulatory Visit | Attending: Radiation Oncology | Admitting: Radiation Oncology

## 2020-03-24 DIAGNOSIS — Z510 Encounter for antineoplastic radiation therapy: Secondary | ICD-10-CM | POA: Diagnosis not present

## 2020-03-25 ENCOUNTER — Ambulatory Visit
Admission: RE | Admit: 2020-03-25 | Discharge: 2020-03-25 | Disposition: A | Discharge status: Home / Self Care | Payer: Medicare HMO | Source: Ambulatory Visit | Attending: Radiation Oncology | Admitting: Radiation Oncology

## 2020-03-25 DIAGNOSIS — Z510 Encounter for antineoplastic radiation therapy: Secondary | ICD-10-CM | POA: Diagnosis not present

## 2020-03-28 ENCOUNTER — Ambulatory Visit
Admission: RE | Admit: 2020-03-28 | Discharge: 2020-03-28 | Disposition: A | Discharge status: Home / Self Care | Payer: Medicare HMO | Source: Ambulatory Visit | Attending: Radiation Oncology | Admitting: Radiation Oncology

## 2020-03-28 DIAGNOSIS — Z510 Encounter for antineoplastic radiation therapy: Secondary | ICD-10-CM | POA: Diagnosis not present

## 2020-03-29 ENCOUNTER — Ambulatory Visit
Admission: RE | Admit: 2020-03-29 | Discharge: 2020-03-29 | Disposition: A | Discharge status: Home / Self Care | Payer: Medicare HMO | Source: Ambulatory Visit | Attending: Radiation Oncology | Admitting: Radiation Oncology

## 2020-03-29 DIAGNOSIS — Z510 Encounter for antineoplastic radiation therapy: Secondary | ICD-10-CM | POA: Diagnosis not present

## 2020-03-30 ENCOUNTER — Ambulatory Visit
Admission: RE | Admit: 2020-03-30 | Discharge: 2020-03-30 | Disposition: A | Discharge status: Home / Self Care | Payer: Medicare HMO | Source: Ambulatory Visit | Attending: Radiation Oncology | Admitting: Radiation Oncology

## 2020-03-30 DIAGNOSIS — Z510 Encounter for antineoplastic radiation therapy: Secondary | ICD-10-CM | POA: Diagnosis not present

## 2020-03-31 ENCOUNTER — Ambulatory Visit
Admission: RE | Admit: 2020-03-31 | Discharge: 2020-03-31 | Disposition: A | Discharge status: Home / Self Care | Payer: Medicare HMO | Source: Ambulatory Visit | Attending: Radiation Oncology | Admitting: Radiation Oncology

## 2020-03-31 ENCOUNTER — Other Ambulatory Visit: Payer: Self-pay

## 2020-03-31 DIAGNOSIS — Z510 Encounter for antineoplastic radiation therapy: Secondary | ICD-10-CM | POA: Diagnosis not present

## 2020-04-01 ENCOUNTER — Ambulatory Visit
Admission: RE | Admit: 2020-04-01 | Discharge: 2020-04-01 | Disposition: A | Discharge status: Home / Self Care | Payer: Medicare HMO | Source: Ambulatory Visit | Attending: Radiation Oncology | Admitting: Radiation Oncology

## 2020-04-01 DIAGNOSIS — Z510 Encounter for antineoplastic radiation therapy: Secondary | ICD-10-CM | POA: Diagnosis not present

## 2020-04-04 ENCOUNTER — Ambulatory Visit
Admission: RE | Admit: 2020-04-04 | Discharge: 2020-04-04 | Disposition: A | Discharge status: Home / Self Care | Payer: Medicare HMO | Source: Ambulatory Visit | Attending: Radiation Oncology | Admitting: Radiation Oncology

## 2020-04-04 ENCOUNTER — Other Ambulatory Visit: Payer: Self-pay

## 2020-04-04 DIAGNOSIS — Z510 Encounter for antineoplastic radiation therapy: Secondary | ICD-10-CM | POA: Diagnosis not present

## 2020-04-05 ENCOUNTER — Ambulatory Visit
Admission: RE | Admit: 2020-04-05 | Discharge: 2020-04-05 | Disposition: A | Discharge status: Home / Self Care | Payer: Medicare HMO | Source: Ambulatory Visit | Attending: Radiation Oncology | Admitting: Radiation Oncology

## 2020-04-05 DIAGNOSIS — Z510 Encounter for antineoplastic radiation therapy: Secondary | ICD-10-CM | POA: Diagnosis not present

## 2020-04-06 ENCOUNTER — Ambulatory Visit
Admission: RE | Admit: 2020-04-06 | Discharge: 2020-04-06 | Disposition: A | Discharge status: Home / Self Care | Payer: Medicare HMO | Source: Ambulatory Visit | Attending: Radiation Oncology | Admitting: Radiation Oncology

## 2020-04-06 DIAGNOSIS — Z510 Encounter for antineoplastic radiation therapy: Secondary | ICD-10-CM | POA: Diagnosis not present

## 2020-04-07 ENCOUNTER — Ambulatory Visit
Admission: RE | Admit: 2020-04-07 | Discharge: 2020-04-07 | Disposition: A | Discharge status: Home / Self Care | Payer: Medicare HMO | Source: Ambulatory Visit | Attending: Radiation Oncology | Admitting: Radiation Oncology

## 2020-04-07 DIAGNOSIS — Z510 Encounter for antineoplastic radiation therapy: Secondary | ICD-10-CM | POA: Diagnosis not present

## 2020-04-08 ENCOUNTER — Ambulatory Visit
Admission: RE | Admit: 2020-04-08 | Discharge: 2020-04-08 | Disposition: A | Discharge status: Home / Self Care | Payer: Medicare HMO | Source: Ambulatory Visit | Attending: Radiation Oncology | Admitting: Radiation Oncology

## 2020-04-08 DIAGNOSIS — Z510 Encounter for antineoplastic radiation therapy: Secondary | ICD-10-CM | POA: Diagnosis not present

## 2020-04-10 ENCOUNTER — Ambulatory Visit
Admission: RE | Admit: 2020-04-10 | Discharge: 2020-04-10 | Disposition: A | Discharge status: Home / Self Care | Payer: Medicare HMO | Source: Ambulatory Visit | Attending: Radiation Oncology | Admitting: Radiation Oncology

## 2020-04-10 ENCOUNTER — Other Ambulatory Visit: Payer: Self-pay

## 2020-04-10 DIAGNOSIS — Z510 Encounter for antineoplastic radiation therapy: Secondary | ICD-10-CM | POA: Diagnosis not present

## 2020-04-11 ENCOUNTER — Other Ambulatory Visit: Payer: Self-pay

## 2020-04-11 ENCOUNTER — Ambulatory Visit
Admission: RE | Admit: 2020-04-11 | Discharge: 2020-04-11 | Disposition: A | Discharge status: Home / Self Care | Payer: Medicare HMO | Source: Ambulatory Visit | Attending: Radiation Oncology | Admitting: Radiation Oncology

## 2020-04-11 DIAGNOSIS — Z510 Encounter for antineoplastic radiation therapy: Secondary | ICD-10-CM | POA: Diagnosis not present

## 2020-04-12 ENCOUNTER — Ambulatory Visit
Admission: RE | Admit: 2020-04-12 | Discharge: 2020-04-12 | Disposition: A | Discharge status: Home / Self Care | Payer: Medicare HMO | Source: Ambulatory Visit | Attending: Radiation Oncology | Admitting: Radiation Oncology

## 2020-04-12 DIAGNOSIS — Z510 Encounter for antineoplastic radiation therapy: Secondary | ICD-10-CM | POA: Diagnosis not present

## 2020-04-13 ENCOUNTER — Ambulatory Visit
Admission: RE | Admit: 2020-04-13 | Discharge: 2020-04-13 | Disposition: A | Discharge status: Home / Self Care | Payer: Medicare HMO | Source: Ambulatory Visit | Attending: Radiation Oncology | Admitting: Radiation Oncology

## 2020-04-13 DIAGNOSIS — Z510 Encounter for antineoplastic radiation therapy: Secondary | ICD-10-CM | POA: Diagnosis not present

## 2020-04-18 ENCOUNTER — Encounter: Payer: Self-pay | Admitting: Radiation Oncology

## 2020-04-18 ENCOUNTER — Ambulatory Visit
Admission: RE | Admit: 2020-04-18 | Discharge: 2020-04-18 | Disposition: A | Discharge status: Home / Self Care | Payer: Medicare HMO | Source: Ambulatory Visit | Attending: Radiation Oncology | Admitting: Radiation Oncology

## 2020-04-18 ENCOUNTER — Encounter: Payer: Self-pay | Admitting: Medical Oncology

## 2020-04-18 ENCOUNTER — Ambulatory Visit: Payer: Medicare HMO

## 2020-04-18 ENCOUNTER — Encounter: Payer: Self-pay | Admitting: Urology

## 2020-04-18 DIAGNOSIS — Z510 Encounter for antineoplastic radiation therapy: Secondary | ICD-10-CM | POA: Diagnosis not present

## 2020-04-19 ENCOUNTER — Ambulatory Visit: Payer: Medicare HMO

## 2020-04-19 ENCOUNTER — Encounter: Payer: Self-pay | Admitting: Radiation Oncology

## 2020-04-19 ENCOUNTER — Telehealth: Payer: Self-pay | Admitting: Radiation Oncology

## 2020-04-19 NOTE — Telephone Encounter (Signed)
Phoned to make him aware the letter he requested will be waiting for him to pick up at the front desk of the cancer center tomorrow morning at 0800. Patient verbalized understanding and appreciation.

## 2020-04-25 NOTE — Progress Notes (Signed)
  Radiation Oncology         [phone] ________________________________  Name: Jeffrey Santiago MRN: [id number]  Date: 03/14/2020  DOB: 1960/11/03  3D Planning Note   Prostate Brachytherapy Post-Implant Dosimetry  Diagnosis: 59 y.o. gentleman with Stage T1c adenocarcinoma of the prostate with Gleason score of 3+4, and PSA of 10.17.  Narrative: On a previous date, Jeffrey Santiago returned following prostate seed implantation for post implant planning. He underwent CT scan complex simulation to delineate the three-dimensional structures of the pelvis and demonstrate the radiation distribution.  Since that time, the seed localization, and complex isodose planning with dose volume histograms have now been completed.  Results:   Prostate Coverage - The dose of radiation delivered to the 90% or more of the prostate gland (D90) was 93.15% of the prescription dose. This exceeds our goal of greater than 90%. Rectal Sparing - The volume of rectal tissue receiving the prescription dose or higher was 0.0 cc. This falls under our thresholds tolerance of 1.0 cc.  Impression: The prostate seed implant appears to show adequate target coverage and appropriate rectal sparing.  Plan:  The patient will continue to follow with urology for ongoing PSA determinations. I would anticipate a high likelihood for local tumor control with minimal risk for rectal morbidity.  ________________________________  Sheral Apley Tammi Klippel, M.D.

## 2020-05-11 ENCOUNTER — Telehealth: Payer: Self-pay

## 2020-05-11 NOTE — Telephone Encounter (Signed)
Left voicemail message in regards to telephone visit with Freeman Caldron PA on 05/19/20. Called to review meaningful use , AUA and prostate questions. TM

## 2020-05-16 ENCOUNTER — Telehealth: Payer: Self-pay

## 2020-05-16 ENCOUNTER — Encounter: Payer: Self-pay | Admitting: Urology

## 2020-05-16 NOTE — Progress Notes (Signed)
Patient has telephone follow-up appointment with Ashlyn Bruning PA. Patient states nocturia 10 times per night. Patient denies dysuria or hematuria. Patient states urine stream is strong and steady. Patient denies having to push or strain to start urination. Patient denies urgency. Patient denies leakage. Patient states that he does not have a follow-up with [facility].

## 2020-05-16 NOTE — Telephone Encounter (Signed)
Spoke with patient in regards to follow-up appointment with Marcello Fennel PA on 05/19/20 @ 8:30pm. Patient verbalized understanding of appointment date and time. Reviewed AUA, meaningful use and prostate questions. TM

## 2020-05-19 ENCOUNTER — Ambulatory Visit
Admission: RE | Admit: 2020-05-19 | Discharge: 2020-05-19 | Disposition: A | Discharge status: Home / Self Care | Payer: Medicare HMO | Source: Ambulatory Visit | Attending: Urology | Admitting: Urology

## 2020-05-19 ENCOUNTER — Other Ambulatory Visit: Payer: Self-pay

## 2020-05-19 DIAGNOSIS — C61 Malignant neoplasm of prostate: Secondary | ICD-10-CM

## 2020-05-19 NOTE — Progress Notes (Signed)
  Radiation Oncology         [phone] ________________________________  Name: Jeffrey Santiago MRN: [id number]  Date: 04/18/2020  DOB: Jan 11, 1961  End of Treatment Note  Diagnosis:   59 y.o. gentleman with Stage T1c adenocarcinoma of the prostate with Gleason score of 3+4, and PSA of 10.17.     Indication for treatment:  Curative, Definitive Radiotherapy       Radiation treatment dates:   02/02/20; 03/14/20 - 04/18/20  Site/dose: 02/02/20: Insertion of radioactive I-125 seeds into the prostate gland.; 110 Gy, boost therapy.  03/14/20 - 04/18/20: The prostate and pelvic lymph nodes were treated to 45 Gy in 25 fractions of 1.8 Gy, to supplement an up-front prostate seed implant boost of 110 Gy to achieve a total nominal dose of 155 Gy.  Beams/energy:   The patient was treated with IMRT using volumetric arc therapy delivering 6 MV X-rays to clockwise and counterclockwise circumferential arcs with a 90 degree collimator offset to avoid dose scalloping.  Image guidance was performed with daily cone beam CT prior to each fraction to align to gold markers in the prostate and assure proper bladder and rectal fill volumes.  Immobilization was achieved with [employer] custom mold.  Narrative: The patient tolerated radiation treatment relatively well with only minor urinary irritation and modest fatigue.  He did experience feelings of incomplete emptying and nocturia every 30 minutes to 1 hour, approximately 10-20 times per night but denied excessive daytime frequency or urgency.  He he continued taking Flomax daily as prescribed and maintained a good flow of stream and felt that he emptied his bladder well throughout the day.  He denied dysuria, gross hematuria, suprapubic discomfort, straining to void or incontinence.  He did develop diarrhea which was managed with Pepto-Bismol and/or Imodium as needed.  He denied any significant fatigue.  Plan: The patient has completed radiation treatment. He will return  to radiation oncology clinic for routine followup in one month. I advised him to call or return sooner if he has any questions or concerns related to his recovery or treatment. ________________________________  Artist Pais. Kathrynn Running, M.D.

## 2020-05-19 NOTE — Progress Notes (Signed)
Radiation Oncology         ([phone]) [phone] ________________________________  Name: Patryck Kilgore MRN: [id number]  Date: 05/19/2020  DOB: [birth year]  Post Treatment Note  CC: Matilde Haymaker, FNP  Matilde Haymaker, FNP  Diagnosis:   59 y.o.gentleman with Stage T1cadenocarcinoma of the prostate with Gleason score of 3+4, and PSA of10.17.   Interval Since Last Radiation:  4.5 weeks, concurrent with ST-ADT (he had a 6 month Eligard on 08/24/19)  02/02/20: Insertion of radioactive I-125 seeds into the prostate gland.; 110Gy, boost therapy.  03/14/20 - 04/18/20: The prostate and pelvic lymph nodes were treated to 45 Gy in 25 fractions of 1.8 Gy, to supplement an up-front prostate seed implant boost of 110 Gy to achieve a total nominal dose of 155 Gy.   Narrative:  I spoke with the patient to conduct his routine scheduled 1 month follow up visit via telephone to spare the patient unnecessary potential exposure in the healthcare setting during the current COVID-19 pandemic.  The patient was notified in advance and gave permission to proceed with this visit format. He tolerated radiation treatment relatively well with only minor urinary irritation and modest fatigue.  He did experience feelings of incomplete emptying and nocturia every 30 minutes to 1 hour, approximately 10-20 times per night but denied excessive daytime frequency or urgency.  He he continued taking Flomax daily as prescribed and maintained a good flow of stream and felt that he emptied his bladder well throughout the day.  He denied dysuria, gross hematuria, suprapubic discomfort, straining to void or incontinence.  He did develop diarrhea which was managed with Pepto-Bismol and/or Imodium as needed.  He denied any significant fatigue.                              On review of systems, the patient states that he is doing well in general.  He reports gradual improvement in his LUTS, particularly the nocturia which is now decreased to  approximately 5-10 times per night as opposed to 20 times per night during treatment.  His current IPSS score is 13, indicating moderate urinary symptoms, the majority of those associated with the nocturia and incomplete bladder emptying at night.  Throughout the day, he continues with a good strong stream and feels that he empties his bladder well.  He denies excessive daytime frequency, urgency, dysuria, gross hematuria or incontinence.  He reports that his bowel issues have fully resolved and he denies any abdominal pain, nausea, vomiting, diarrhea or constipation.  He reports a healthy appetite and is maintaining his weight.  He denies any significant fatigue.  Overall, he is quite pleased with his progress to date.  ALLERGIES:  has No Known Allergies.  Meds: Current Outpatient Medications  Medication Sig Dispense Refill  . amLODipine (NORVASC) 10 MG tablet Take by mouth.    . olmesartan (BENICAR) 40 MG tablet TAKE 1 TABLET BY MOUTH DAILY    . prochlorperazine (COMPAZINE) 10 MG tablet Take 1 tablet (10 mg total) by mouth every 8 (eight) hours as needed for nausea or vomiting. 30 tablet 0  . sildenafil (VIAGRA) 100 MG tablet Take 1/2 to 1 tablet as needed.  Do not use if taking nitrates for heart disease.    . tamsulosin (FLOMAX) 0.4 MG CAPS capsule TAKE 1 CAPSULE BY MOUTH DAILY    . VITAMIN D PO Take by mouth daily.     No current facility-administered medications for this  encounter.    Physical Findings:  vitals were not taken for this visit.   /10 Unable to assess due to telephone follow-up visit format.  Lab Findings: Lab Results  Component Value Date   WBC 6.2 01/29/2020   HGB 11.9 (L) 01/29/2020   HCT 35.7 (L) 01/29/2020   MCV 79.5 (L) 01/29/2020   PLT 277 01/29/2020     Radiographic Findings: No results found.  Impression/Plan: 1. 59 y.o.gentleman with Stage T1cadenocarcinoma of the prostate with Gleason score of 3+4, and PSA of10.17.  He will continue to follow up  with urology for ongoing PSA determinations but is in the process of relocating to [place] and establishing a new urologist there.  He has Robin Bass's contact number so that he can get records transferred once he establishes care.  He understands what to expect with regards to PSA monitoring going forward. I will look forward to following his response to treatment via correspondence with urology, and would be happy to continue to participate in his care if clinically indicated.  He will continue taking Flomax as prescribed and we also talked about what to expect in the future, including his risk for erectile dysfunction and rectal bleeding. I encouraged him to call or return to the office if he has any questions regarding his previous radiation or possible radiation side effects. He was comfortable with this plan and will follow up as needed.    Nicholos Johns, PA-C

## 2020-07-05 ENCOUNTER — Encounter: Payer: Self-pay | Admitting: Medical Oncology

## 2020-07-05 NOTE — Progress Notes (Unsigned)
Called patient inquire about letter regarding his radiation in the fall of 2021.  Due to receiving radiation, he had to cancel a flight and is requesting a refund from the company.  In order to process this, they are requesting a letter indicating that he was unable to fly due to his procedure and cancer treatments.  Jeffrey Santiago had prepared a letter for him but he was working out of town and someone opened it and discarded. I will get the letter date updated and he requested we mail a copy. I verified his mailing address.

## 2020-07-21 ENCOUNTER — Encounter: Payer: Self-pay | Admitting: Medical Oncology

## 2020-07-21 ENCOUNTER — Encounter: Payer: Self-pay | Admitting: Radiation Oncology

## 2020-07-22 ENCOUNTER — Encounter: Payer: Self-pay | Admitting: Radiation Oncology

## 2020-07-22 ENCOUNTER — Encounter: Payer: Self-pay | Admitting: Medical Oncology

## 2020-07-22 NOTE — Progress Notes (Signed)
Completed [employer] Claims form to the best of my ability. Patient attempting to be refunded for cancelling travel due to radiation side effects. Placed forms in [place] to sign.

## 2020-07-22 NOTE — Progress Notes (Signed)
Pt called asking about letter for his air line ticket. I explained that I spoke with Sam and she will change the date. He asked if he can get another letter stating he had side effects post radiation that hindered him from [place]. I will discuss with Sam and will call him in the am when it is ready for pick up. He voiced understanding.

## 2020-07-22 NOTE — Progress Notes (Signed)
Spoke with patient to inform him letters will be at front desk for him to pick up. He voiced understanding.

## 2020-07-28 ENCOUNTER — Telehealth: Payer: Self-pay | Admitting: Radiation Oncology

## 2020-07-28 NOTE — Telephone Encounter (Signed)
Phoned patient to inform him that his [place] paperwork is at the front desk of the cancer center ready to pick up. Patient verbalized understanding. Copy of paperwork placed in box to be scanned into the patient's EMR.

## 2020-10-24 ENCOUNTER — Emergency Department (HOSPITAL_COMMUNITY): Payer: Medicare HMO

## 2020-10-24 ENCOUNTER — Emergency Department (HOSPITAL_COMMUNITY)
Admission: EM | Admit: 2020-10-24 | Discharge: 2020-10-24 | Disposition: A | Discharge status: Home / Self Care | Payer: Medicare HMO | Attending: Emergency Medicine | Admitting: Emergency Medicine

## 2020-10-24 ENCOUNTER — Other Ambulatory Visit: Payer: Self-pay

## 2020-10-24 DIAGNOSIS — Z87891 Personal history of nicotine dependence: Secondary | ICD-10-CM | POA: Diagnosis not present

## 2020-10-24 DIAGNOSIS — S0101XA Laceration without foreign body of scalp, initial encounter: Secondary | ICD-10-CM | POA: Diagnosis not present

## 2020-10-24 DIAGNOSIS — X58XXXA Exposure to other specified factors, initial encounter: Secondary | ICD-10-CM | POA: Diagnosis not present

## 2020-10-24 DIAGNOSIS — S0990XA Unspecified injury of head, initial encounter: Secondary | ICD-10-CM | POA: Diagnosis present

## 2020-10-24 DIAGNOSIS — Z23 Encounter for immunization: Secondary | ICD-10-CM | POA: Insufficient documentation

## 2020-10-24 DIAGNOSIS — Z79899 Other long term (current) drug therapy: Secondary | ICD-10-CM | POA: Diagnosis not present

## 2020-10-24 DIAGNOSIS — Z8546 Personal history of malignant neoplasm of prostate: Secondary | ICD-10-CM | POA: Insufficient documentation

## 2020-10-24 DIAGNOSIS — I10 Essential (primary) hypertension: Secondary | ICD-10-CM | POA: Insufficient documentation

## 2020-10-24 LAB — CBC
HCT: 37.4 % — ABNORMAL LOW (ref 39.0–52.0)
Hemoglobin: 11.8 g/dL — ABNORMAL LOW (ref 13.0–17.0)
MCH: 25.5 pg — ABNORMAL LOW (ref 26.0–34.0)
MCHC: 31.6 g/dL (ref 30.0–36.0)
MCV: 81 fL (ref 80.0–100.0)
Platelets: 256 10*3/uL (ref 150–400)
RBC: 4.62 MIL/uL (ref 4.22–5.81)
RDW: 15.9 % — ABNORMAL HIGH (ref 11.5–15.5)
WBC: 8.4 10*3/uL (ref 4.0–10.5)
nRBC: 0 % (ref 0.0–0.2)

## 2020-10-24 LAB — BASIC METABOLIC PANEL
Anion gap: 12 (ref 5–15)
BUN: 29 mg/dL — ABNORMAL HIGH (ref 8–23)
CO2: 25 mmol/L (ref 22–32)
Calcium: 9 mg/dL (ref 8.9–10.3)
Chloride: 98 mmol/L (ref 98–111)
Creatinine, Ser: 1.09 mg/dL (ref 0.61–1.24)
GFR, Estimated: >60 mL/min (ref >60)
Glucose, Bld: 103 mg/dL — ABNORMAL HIGH (ref 70–99)
Potassium: 3.7 mmol/L (ref 3.5–5.1)
Sodium: 135 mmol/L (ref 135–145)

## 2020-10-24 LAB — URINALYSIS, ROUTINE W REFLEX MICROSCOPIC
Bilirubin Urine: NEGATIVE
Glucose, UA: NEGATIVE mg/dL
Hgb urine dipstick: NEGATIVE
Ketones, ur: 20 mg/dL — AB
Leukocytes,Ua: NEGATIVE
Nitrite: NEGATIVE
Protein, ur: NEGATIVE mg/dL
Specific Gravity, Urine: 1.021 (ref 1.005–1.030)
pH: 5 (ref 5.0–8.0)

## 2020-10-24 LAB — RAPID URINE DRUG SCREEN, HOSP PERFORMED
Amphetamines: NOT DETECTED
Barbiturates: NOT DETECTED
Benzodiazepines: NOT DETECTED
Cocaine: POSITIVE — AB
Opiates: NOT DETECTED
Tetrahydrocannabinol: NOT DETECTED

## 2020-10-24 MED ORDER — LIDOCAINE HCL (PF) 1 % IJ SOLN
5.0000 mL | Freq: Once | INTRAMUSCULAR | Status: AC
  Administered 2020-10-24: 5 mL
  Filled 2020-10-24: qty 5

## 2020-10-24 MED ORDER — TETANUS-DIPHTH-ACELL PERTUSSIS 5-2.5-18.5 LF-MCG/0.5 IM SUSY
0.5000 mL | PREFILLED_SYRINGE | Freq: Once | INTRAMUSCULAR | Status: AC
  Administered 2020-10-24: 0.5 mL via INTRAMUSCULAR
  Filled 2020-10-24: qty 0.5

## 2020-10-24 MED ORDER — KETOROLAC TROMETHAMINE 15 MG/ML IJ SOLN
15.0000 mg | Freq: Once | INTRAMUSCULAR | Status: DC
  Filled 2020-10-24: qty 1

## 2020-10-24 MED ORDER — LIDOCAINE-EPINEPHRINE-TETRACAINE (LET) TOPICAL GEL
3.0000 mL | Freq: Once | TOPICAL | Status: AC
  Administered 2020-10-24: 3 mL via TOPICAL
  Filled 2020-10-24: qty 3

## 2020-10-24 MED ORDER — KETOROLAC TROMETHAMINE 15 MG/ML IJ SOLN
15.0000 mg | Freq: Once | INTRAMUSCULAR | Status: AC
  Administered 2020-10-24: 15 mg via INTRAMUSCULAR

## 2020-10-24 NOTE — ED Provider Notes (Signed)
Emergency Medicine Provider Triage Evaluation Note  Jeffrey Santiago , a 60 y.o. male  was evaluated in triage.  Pt complains of head trauma.  Patient fell in bathroom, his back of his head and lost consciousness for a few hours. No vision changes, no nausea, no vomiting. Not on blood thinners  Review of Systems  Positive: Fall, LOC, head lac Negative: Nausea, vomiting, vision changes, headache   Physical Exam  BP 125/78 (BP Location: Left Arm)   Pulse 78   Temp 97.8 F (36.6 C) (Oral)   Resp 16   SpO2 97%  Gen:   Awake, no distress   Resp:  Normal effort  MSK:   Moves extremities without difficulty  Other:  AO x 3. CN III-XII grossly intact. 3 cm laceration to back of head.   Medical Decision Making  Medically screening exam initiated at 10:36 AM.  Appropriate orders placed.  Jeffrey Santiago was informed that the remainder of the evaluation will be completed by another provider, this initial triage assessment does not replace that evaluation, and the importance of remaining in the ED until their evaluation is complete.    Jeffrey Raring, PA-C 10/24/20 1039    Jeffrey Shanks, MD 11/01/20 2150

## 2020-10-24 NOTE — ED Notes (Signed)
Patient ambulated with steady gait.

## 2020-10-24 NOTE — ED Notes (Signed)
Patient transported to CT 

## 2020-10-24 NOTE — ED Provider Notes (Signed)
[facility] EMERGENCY DEPARTMENT Provider Note   CSN: [id number] Arrival date & time: 10/24/20  1007     History Chief Complaint  Patient presents with  . Laceration    Jeffrey Santiago is a 60 y.o. male.  HPI      Jeffrey Santiago is a 60 y.o. male, with a history of cocaine use, HTN, presenting to the ED with scalp laceration and syncope.  Patient states he has not been eating or drinking much water over the last couple days.  Instead he has been on a alcohol and crack cocaine binge. He began to feel lightheaded in the bathroom and sustained a syncopal episode.  When he awoke, he noted the back of his head hurt and he was bleeding.  He drove himself to urgent care who immediately walked the patient down to the ED. Denies anticoagulation. Denies fever/chills, recent illness, current dizziness/lightheadedness, chest pain, abdominal pain, shortness of breath, N/V/D, neck/back pain, numbness, weakness, or any other complaints.   Past Medical History:  Diagnosis Date  . Full dentures   . History of cocaine use    last used per pt 2015  . Hyperplasia of prostate with lower urinary tract symptoms (LUTS)   . Hypertension    followed by pcp  (01-29-2020 per pt had stress test approx. 2005, told ok)  . Prostate cancer [facility]) urologist--- dr eskrige/  oncologist--- dr Tammi Klippel   dx 03/ 2021,  Stage T1c, Gleason 3+4    Patient Active Problem List   Diagnosis Date Noted  . 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 PC (prostate cancer) ([place]) 08/07/2019  . Elevated PSA 06/26/2019  . Microscopic hematuria 06/26/2019    Past Surgical History:  Procedure Laterality Date  . CYSTOSCOPY N/A 02/02/2020   Procedure: CYSTOSCOPY FLEXIBLE;  Surgeon: Festus Aloe, MD;  Location: [facility];  Service: Urology;  Laterality: N/A;  . RADIOACTIVE SEED IMPLANT N/A 02/02/2020   Procedure: RADIOACTIVE SEED IMPLANT/BRACHYTHERAPY IMPLANT;  Surgeon: Festus Aloe, MD;   Location: [facility];  Service: Urology;  Laterality: N/A;  . SATURATION BIOPSY OF PROSTATE  07-22-2019  @[facility]   (general anesthesia)   ultrasound guided and cystoscopy  . SPACE OAR INSTILLATION N/A 02/02/2020   Procedure: SPACE OAR INSTILLATION;  Surgeon: Festus Aloe, MD;  Location: [facility];  Service: Urology;  Laterality: N/A;  . TONSILLECTOMY  child       Family History  Problem Relation Age of Onset  . Diabetes Mother   . Prostate cancer Father   . Colon cancer Father   . Breast cancer Neg Hx   . Pancreatic cancer Neg Hx     Social History   Tobacco Use  . Smoking status: Former Smoker    Years: 10.00    Types: Cigarettes    Quit date: 01/22/2020    Years since quitting: 0.7  . Smokeless tobacco: Never Used  . Tobacco comment: 01-29-2020  pt stated he quit 01-22-2020  Vaping Use  . Vaping Use: Never used  Substance Use Topics  . Alcohol use: Not Currently  . Drug use: Not Currently    Comment: 01-29-2020  per pt hx cocaine use quit 2015    Home Medications Prior to Admission medications   Medication Sig Start Date End Date Taking? Authorizing Provider  amLODipine (NORVASC) 10 MG tablet Take by mouth.    [provider]  olmesartan (BENICAR) 40 MG tablet TAKE 1 TABLET BY MOUTH DAILY 07/22/19   [provider]  prochlorperazine (COMPAZINE) 10 MG tablet Take 1 tablet (10 mg total) by mouth every 8 (eight) hours as needed for nausea or vomiting. 03/18/20   Kyung Rudd, MD  sildenafil (VIAGRA) 100 MG tablet Take 1/2 to 1 tablet as needed.  Do not use if taking nitrates for heart disease. 08/24/19   [provider]  tamsulosin (FLOMAX) 0.4 MG CAPS capsule TAKE 1 CAPSULE BY MOUTH DAILY 07/22/19   [provider]  VITAMIN D PO Take by mouth daily.    [provider]    Allergies    Patient has no known allergies.  Review of Systems   Review of Systems  Constitutional: Negative for chills,  diaphoresis and fever.  Respiratory: Negative for cough and shortness of breath.   Cardiovascular: Negative for chest pain.  Gastrointestinal: Negative for abdominal pain, diarrhea, nausea and vomiting.  Genitourinary: Negative for dysuria.  Musculoskeletal: Negative for back pain and neck pain.  Skin: Positive for wound.  Neurological: Positive for syncope. Negative for dizziness, weakness and numbness.  All other systems reviewed and are negative.   Physical Exam Updated Vital Signs BP 124/69 (BP Location: Right Arm)   Pulse (!) 56   Temp 97.8 F (36.6 C) (Oral)   Resp 16   Ht 5\' 11"  (1.803 m)   Wt 105.2 kg   SpO2 95%   BMI 32.36 kg/m   Physical Exam Vitals and nursing note reviewed.  Constitutional:      General: He is not in acute distress.    Appearance: He is well-developed. He is not diaphoretic.  HENT:     Head: Normocephalic.     Comments: Approximately 5 cm linear laceration to the occipital scalp. Some surrounding tenderness as well as swelling. The rest of the scalp and face were examined without evidence of additional injury.    Mouth/Throat:     Mouth: Mucous membranes are moist.     Pharynx: Oropharynx is clear.  Eyes:     Conjunctiva/sclera: Conjunctivae normal.  Cardiovascular:     Rate and Rhythm: Normal rate and regular rhythm.     Pulses: Normal pulses.          Radial pulses are 2+ on the right side and 2+ on the left side.       Posterior tibial pulses are 2+ on the right side and 2+ on the left side.     Heart sounds: Normal heart sounds.     Comments: Tactile temperature in the extremities appropriate and equal bilaterally. Pulmonary:     Effort: Pulmonary effort is normal. No respiratory distress.     Breath sounds: Normal breath sounds.  Abdominal:     Palpations: Abdomen is soft.     Tenderness: There is no abdominal tenderness. There is no guarding.  Musculoskeletal:     Cervical back: Normal range of motion and neck supple. No  tenderness.     Right lower leg: No edema.     Left lower leg: No edema.     Comments: Normal motor function intact in all extremities. No midline spinal tenderness.   Skin:    General: Skin is warm and dry.  Neurological:     Mental Status: He is alert and oriented to person, place, and time.     Comments: No noted acute cognitive deficit. Sensation grossly intact to light touch in the extremities.   Grip strengths equal bilaterally.   Strength 5/5 in all extremities.  No gait disturbance.  Coordination intact.  Cranial  nerves III-XII grossly intact.  Handles oral secretions without noted difficulty.  No noted phonation or speech deficit. No facial droop.   Psychiatric:        Mood and Affect: Mood and affect normal.        Speech: Speech normal.        Behavior: Behavior normal.     ED Results / Procedures / Treatments   Labs (all labs ordered are listed, but only abnormal results are displayed) Labs Reviewed  BASIC METABOLIC PANEL - Abnormal; Notable for the following components:      Result Value   Glucose, Bld 103 (*)    BUN 29 (*)    All other components within normal limits  CBC - Abnormal; Notable for the following components:   Hemoglobin 11.8 (*)    HCT 37.4 (*)    MCH 25.5 (*)    RDW 15.9 (*)    All other components within normal limits  URINALYSIS, ROUTINE W REFLEX MICROSCOPIC - Abnormal; Notable for the following components:   Ketones, ur 20 (*)    All other components within normal limits  RAPID URINE DRUG SCREEN, HOSP PERFORMED - Abnormal; Notable for the following components:   Cocaine POSITIVE (*)    All other components within normal limits  CBG MONITORING, ED    EKG EKG Interpretation  Date/Time:  Monday October 24 2020 10:34:29 EDT Ventricular Rate:  67 PR Interval:  190 QRS Duration: 96 QT Interval:  416 QTC Calculation: 439 R Axis:   23 Text Interpretation: Sinus rhythm with marked sinus arrhythmia Confirmed by Lajean Saver [phone]) on  10/24/2020 11:44:24 AM   Radiology CT Head Wo Contrast  Result Date: 10/24/2020 CLINICAL DATA:  Dizziness, head laceration. EXAM: CT HEAD WITHOUT CONTRAST TECHNIQUE: Contiguous axial images were obtained from the base of the skull through the vertex without intravenous contrast. COMPARISON:  None. FINDINGS: Brain: No evidence of acute infarction, hemorrhage, hydrocephalus, extra-axial collection or mass lesion/mass effect. Vascular: No hyperdense vessel or unexpected calcification. Skull: Normal. Negative for fracture or focal lesion. Sinuses/Orbits: No acute finding. Other: Small posterior scalp hematoma is noted. IMPRESSION: Small posterior scalp hematoma. No acute intracranial abnormality seen. Electronically Signed   By: Marijo Conception M.D.   On: 10/24/2020 12:47    Procedures .Marland KitchenLaceration Repair  Date/Time: 10/24/2020 1:20 PM Performed by: Lorayne Bender, PA-C Authorized by: Lorayne Bender, PA-C   Consent:    Consent obtained:  Verbal   Consent given by:  Patient   Risks, benefits, and alternatives were discussed: yes     Risks discussed:  Infection, need for additional repair, poor wound healing, poor cosmetic result and pain Universal protocol:    Procedure explained and questions answered to patient or proxy's satisfaction: yes     Patient identity confirmed:  Verbally with patient and provided demographic data Anesthesia:    Anesthesia method:  Topical application and local infiltration   Topical anesthetic:  LET   Local anesthetic:  Lidocaine 1% w/o epi Laceration details:    Location:  Scalp   Scalp location:  Occipital   Length (cm):  5 Pre-procedure details:    Preparation:  Patient was prepped and draped in usual sterile fashion and imaging obtained to evaluate for foreign bodies Exploration:    Imaging outcome: foreign body not noted     Wound exploration: wound explored through full range of motion and entire depth of wound visualized   Treatment:    Area cleansed with:  Povidone-iodine and saline   Amount of cleaning:  Standard   Irrigation solution:  Sterile saline   Irrigation method:  Syringe Skin repair:    Repair method:  Staples   Number of staples:  6 Approximation:    Approximation:  Close Repair type:    Repair type:  Simple Post-procedure details:    Dressing:  Open (no dressing)   Procedure completion:  Tolerated well, no immediate complications     Medications Ordered in ED Medications  Tdap (BOOSTRIX) injection 0.5 mL (0.5 mLs Intramuscular Given 10/24/20 1258)  lidocaine-EPINEPHrine-tetracaine (LET) topical gel (3 mLs Topical Given 10/24/20 1257)  lidocaine (PF) (XYLOCAINE) 1 % injection 5 mL (5 mLs Infiltration Given 10/24/20 1301)  ketorolac (TORADOL) 15 MG/ML injection 15 mg (15 mg Intramuscular Given 10/24/20 1340)    ED Course  I have reviewed the triage vital signs and the nursing notes.  Pertinent labs & imaging results that were available during my care of the patient were reviewed by me and considered in my medical decision making (see chart for details).    MDM Rules/Calculators/A&P                          Patient presents following an episode of lightheadedness and possible syncope. Patient is nontoxic appearing, afebrile, not tachycardic, not tachypneic, not hypotensive, maintains excellent SPO2 on room air, and is in no apparent distress.   I have reviewed the patient's chart to obtain more information.   I reviewed and interpreted the patient's labs and radiological studies. Patient does not present as clinically intoxicated. Work-up overall reassuring. Patient ambulated independently prior to discharge without noted difficulty. The patient was given instructions for home care as well as return precautions. Patient voices understanding of these instructions, accepts the plan, and is comfortable with discharge.    Vitals:   10/24/20 1330 10/24/20 1345 10/24/20 1400 10/24/20 1506  BP: 137/84 135/88 (!) 148/89  118/81  Pulse: 90 68 88 66  Resp: 16 11 17 18   Temp:      TempSrc:      SpO2: 100% 100% 100% 100%  Weight:      Height:        Final Clinical Impression(s) / ED Diagnoses Final diagnoses:  Injury of head, initial encounter  Laceration of scalp, initial encounter    Rx / DC Orders ED Discharge Orders    None       Layla Maw 10/24/20 1514    Lajean Saver, MD 10/24/20 1727

## 2020-10-24 NOTE — ED Triage Notes (Signed)
Pt arrived to triage after fainting this morning Pt state that he was getting ready in bathroom got dizzy and fainted.   Pt has lactation on top of head. Bleeding controled at this time  States that he is still feeling lightheaded at this time

## 2020-10-24 NOTE — Discharge Instructions (Signed)
Wound Care - Laceration You may remove the bandage after 24 hours. Clean the wound and surrounding area gently with tap water and mild soap. Rinse well and blot dry. Do not scrub the wound, as this may cause the wound edges to come apart. You may shower, but avoid submerging the wound, such as with a bath or swimming. Clean the wound daily to prevent infection. Do not use cleaners such as hydrogen peroxide or alcohol.   Scar reduction: Application of a topical antibiotic ointment, such as Neosporin, after the wound has begun to close and heal well can decrease scab formation and reduce scarring. After the wound has healed and wound closures have been removed, application of ointments such as Aquaphor can also reduce scar formation.  The key to scar reduction is keeping the skin well hydrated and supple. Drinking plenty of water throughout the day (At least eight 8oz glasses of water a day) is essential to staying well hydrated.  Sun exposure: Keep the wound out of the sun. After the wound has healed, continue to protect it from the sun by wearing protective clothing or applying sunscreen.  Pain: You may use Tylenol, naproxen, or ibuprofen for pain.  Suture/staple removal: Return to the ED or go to an urgent care or primary care office in 5 days for suture removal.  Return to the ED sooner should the wound edges come apart or signs of infection arise, such as spreading redness, puffiness/swelling, pus draining from the wound, severe increase in pain, fever over 100.60F, or any other major issues.  For prescription assistance, may try using prescription discount sites or apps, such as goodrx.com   Head Injury You have been seen today for a head injury, which may or may not have resulted in a concussion. It does not appear to be serious at this time, however, it is important to note that your presentation today is not necessarily an indication of the severity of future symptoms.  Expected  symptoms: Expected symptoms of concussion and/or head injury can include nausea, headache, mild dizziness (should still be able to get up and walk around without difficulty), difficulty concentrating, increased sleep, difficulty sleeping, increased intensity of emotions. Close observation: The close observation period is usually 6 hours from the injury. This includes staying awake and having a trustworthy adult monitor you to assure your condition does not worsen. You should be in regular contact with this person and ideally, they should be able to monitor you in person.  Secondary observation: The secondary observation period is usually 24 hours from the injury. You are allowed to sleep during this time. A trustworthy adult should intermittently monitor you to assure your condition does not worsen.   Overall head injury/concussion care: Rest: Be sure to get plenty of rest. You will need more rest and sleep while you recover. Hydration: Be sure to stay well hydrated by having a goal of drinking about 0.5 liters of water an hour. Pain:  Antiinflammatory medications: Take 600 mg of ibuprofen every 6 hours or 440 mg (over the counter dose) to 500 mg (prescription dose) of naproxen every 12 hours or for the next 3 days. After this time, these medications may be used as needed for pain. Take these medications with food to avoid upset stomach. Choose only one of these medications, do not take them together. Tylenol: Should you continue to have additional pain while taking the ibuprofen or naproxen, you may add in tylenol as needed. Your daily total maximum amount of  tylenol from all sources should be limited to 4000mg /day for persons without liver problems, or 2000mg /day for those with liver problems. Return to sports and activities: In general, you may return to normal activities once symptoms have subsided, however, you would ideally be cleared by a primary care provider or other qualified medical professional  prior to return to these activities.  Follow up: Follow up with the concussion clinic or your primary care provider for further management of this issue. Return: Return to the ED should you begin to have confusion, abnormal behavior, aggression, violence, or personality changes, repeated vomiting, vision loss, numbness or weakness on one side of the body, difficulty standing due to dizziness, significantly worsening pain, or any other major concerns.

## 2020-10-29 ENCOUNTER — Encounter (HOSPITAL_COMMUNITY): Payer: Self-pay | Admitting: Emergency Medicine

## 2020-10-29 ENCOUNTER — Other Ambulatory Visit: Payer: Self-pay

## 2020-10-29 ENCOUNTER — Emergency Department (HOSPITAL_COMMUNITY)
Admission: EM | Admit: 2020-10-29 | Discharge: 2020-10-29 | Disposition: A | Discharge status: Home / Self Care | Payer: Medicare HMO | Attending: Emergency Medicine | Admitting: Emergency Medicine

## 2020-10-29 DIAGNOSIS — Z79899 Other long term (current) drug therapy: Secondary | ICD-10-CM | POA: Insufficient documentation

## 2020-10-29 DIAGNOSIS — I10 Essential (primary) hypertension: Secondary | ICD-10-CM | POA: Insufficient documentation

## 2020-10-29 DIAGNOSIS — Z4802 Encounter for removal of sutures: Secondary | ICD-10-CM | POA: Insufficient documentation

## 2020-10-29 DIAGNOSIS — Z87891 Personal history of nicotine dependence: Secondary | ICD-10-CM | POA: Diagnosis not present

## 2020-10-29 DIAGNOSIS — S0101XD Laceration without foreign body of scalp, subsequent encounter: Secondary | ICD-10-CM | POA: Diagnosis present

## 2020-10-29 DIAGNOSIS — X58XXXD Exposure to other specified factors, subsequent encounter: Secondary | ICD-10-CM | POA: Insufficient documentation

## 2020-10-29 NOTE — ED Triage Notes (Signed)
Pt here for staple removal, placed 6/6.

## 2020-10-29 NOTE — ED Provider Notes (Signed)
[facility] EMERGENCY DEPARTMENT Provider Note   CSN: [id number] Arrival date & time: 10/29/20  0841     History Chief Complaint  Patient presents with   Suture / Staple Removal    Jeffrey Santiago is a 60 y.o. male.  Who presents emergency department chief complaint of suture removal.  He had staples placed in his head 5 days ago.  He has no complaints at this time.   Suture / Staple Removal      Past Medical History:  Diagnosis Date   Full dentures    History of cocaine use    last used per pt 2015   Hyperplasia of prostate with lower urinary tract symptoms (LUTS)    Hypertension    followed by pcp  (01-29-2020 per pt had stress test approx. 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   PC (prostate cancer) ([place]) 08/07/2019   Elevated PSA 06/26/2019   Microscopic hematuria 06/26/2019    Past Surgical History:  Procedure Laterality Date   CYSTOSCOPY N/A 02/02/2020   Procedure: CYSTOSCOPY FLEXIBLE;  Surgeon: Festus Aloe, MD;  Location: [facility];  Service: Urology;  Laterality: N/A;   RADIOACTIVE SEED IMPLANT N/A 02/02/2020   Procedure: RADIOACTIVE SEED IMPLANT/BRACHYTHERAPY IMPLANT;  Surgeon: Festus Aloe, MD;  Location: [facility];  Service: Urology;  Laterality: N/A;   SATURATION BIOPSY OF PROSTATE  07-22-2019  @[facility]   (general anesthesia)   ultrasound guided and cystoscopy   SPACE OAR INSTILLATION N/A 02/02/2020   Procedure: SPACE OAR INSTILLATION;  Surgeon: Festus Aloe, MD;  Location: [facility];  Service: Urology;  Laterality: N/A;   TONSILLECTOMY  child       Family History  Problem Relation Age of Onset   Diabetes Mother    Prostate cancer Father    Colon cancer Father    Breast cancer Neg Hx     Pancreatic cancer Neg Hx     Social History   Tobacco Use   Smoking status: Former    Years: 10.00    Pack years: 0.00    Types: Cigarettes    Quit date: 01/22/2020    Years since quitting: 0.7   Smokeless tobacco: Never   Tobacco comments:    01-29-2020  pt stated he quit 01-22-2020  Vaping Use   Vaping Use: Never used  Substance Use Topics   Alcohol use: Not Currently   Drug use: Not Currently    Comment: 01-29-2020  per pt hx cocaine use quit 2015    Home Medications Prior to Admission medications   Medication Sig Start Date End Date Taking? Authorizing Provider  amLODipine (NORVASC) 10 MG tablet Take by mouth.    [provider]  olmesartan (BENICAR) 40 MG tablet TAKE 1 TABLET BY MOUTH DAILY 07/22/19   [provider]  prochlorperazine (COMPAZINE) 10 MG tablet Take 1 tablet (10 mg total) by mouth every 8 (eight) hours as needed for nausea or vomiting. 03/18/20   Kyung Rudd, MD  sildenafil (VIAGRA) 100 MG tablet Take 1/2 to 1 tablet as needed.  Do not use if taking nitrates for heart disease. 08/24/19   [provider]  tamsulosin (FLOMAX) 0.4 MG CAPS capsule TAKE 1 CAPSULE BY MOUTH DAILY 07/22/19   [provider]  VITAMIN  D PO Take by mouth daily.    [provider]    Allergies    Patient has no known allergies.  Review of Systems   Review of Systems Positive for wound, negative for bleeding, negative for pain or fever Physical Exam Updated Vital Signs BP (!) 134/91 (BP Location: Left Arm)   Pulse 85   Temp 98.4 F (36.9 C) (Oral)   Resp 18   Ht 5\' 11"  (1.803 m)   Wt 105 kg   SpO2 100%   BMI 32.29 kg/m   Physical Exam Vitals and nursing note reviewed.  Constitutional:      General: He is not in acute distress.    Appearance: He is well-developed. He is not diaphoretic.  HENT:     Head: Normocephalic and atraumatic.     Comments: Sick staples in place no evidence of infection Eyes:     General: No scleral  icterus.    Conjunctiva/sclera: Conjunctivae normal.  Cardiovascular:     Rate and Rhythm: Normal rate and regular rhythm.     Heart sounds: Normal heart sounds.  Pulmonary:     Effort: Pulmonary effort is normal. No respiratory distress.     Breath sounds: Normal breath sounds.  Abdominal:     Palpations: Abdomen is soft.     Tenderness: There is no abdominal tenderness.  Musculoskeletal:     Cervical back: Normal range of motion and neck supple.  Skin:    General: Skin is warm and dry.  Neurological:     Mental Status: He is alert.  Psychiatric:        Behavior: Behavior normal.    ED Results / Procedures / Treatments   Labs (all labs ordered are listed, but only abnormal results are displayed) Labs Reviewed - No data to display  EKG None  Radiology No results found.  Procedures .Suture Removal  Date/Time: 10/30/2020 2:41 PM Performed by: Margarita Mail, PA-C Authorized by: Margarita Mail, PA-C   Consent:    Consent obtained:  Verbal   Consent given by:  Patient   Risks discussed:  Bleeding, wound separation and pain   Alternatives discussed:  No treatment Universal protocol:    Patient identity confirmed:  Verbally with patient Location:    Location:  [place] location:  Scalp Procedure details:    Wound appearance:  No signs of infection   Number of staples removed:  6 Post-procedure details:    Post-removal:  No dressing applied   Procedure completion:  Tolerated well, no immediate complications   Medications Ordered in ED Medications - No data to display  ED Course  I have reviewed the triage vital signs and the nursing notes.  Pertinent labs & imaging results that were available during my care of the patient were reviewed by me and considered in my medical decision making (see chart for details).    MDM Rules/Calculators/A&P                          Staple removal   Pt to ER for staple/suture removal and wound check as above.  Procedure tolerated well. Vitals normal, no signs of infection. Scar minimization & return precautions given at dc.   Final Clinical Impression(s) / ED Diagnoses Final diagnoses:  None    Rx / DC Orders ED Discharge Orders     None        Margarita Mail, PA-C 10/30/20 1442    [place],  Gwenyth Allegra, MD 10/31/20 [phone]

## 2020-10-29 NOTE — Discharge Instructions (Addendum)
Get help right away if: You have a fever. You have redness that is spreading from your wound.

## 2020-10-29 NOTE — ED Notes (Signed)
Pt discharged by provider.

## 2020-12-09 ENCOUNTER — Telehealth: Payer: Self-pay | Admitting: *Deleted

## 2020-12-09 NOTE — Telephone Encounter (Signed)
Received email from Marisa Severin, Patient Relations Specialist that she had received a call from Mr. Hoeg wanting to schedule his blood work that he has done every 3 months since he now has Medicaid to assist with payment.  I reviewed his chart and communicated with Freeman Caldron, PA .  Patient has completed treatment and been released from the [facility] and is continuing surveillance with Dr. Junious Silk, [facility].  I called [facility] to request appointment and was informed that patient needed to call their Business Office and they would then make the appointment.  I called Mr. Bedore and gave him this information and the phone number to call.  He repeated the information and said he would call to give them his Medicaid information and make the appointment.

## 2021-11-03 IMAGING — DX DG CHEST 2V
2 series · 2 of 2 positions shown · non-contrast
Comparison: 04/27/2018

CLINICAL DATA: Prostate cancer, hypertension, ex-smoker

EXAM:
CHEST - 2 VIEW

[chest pa]
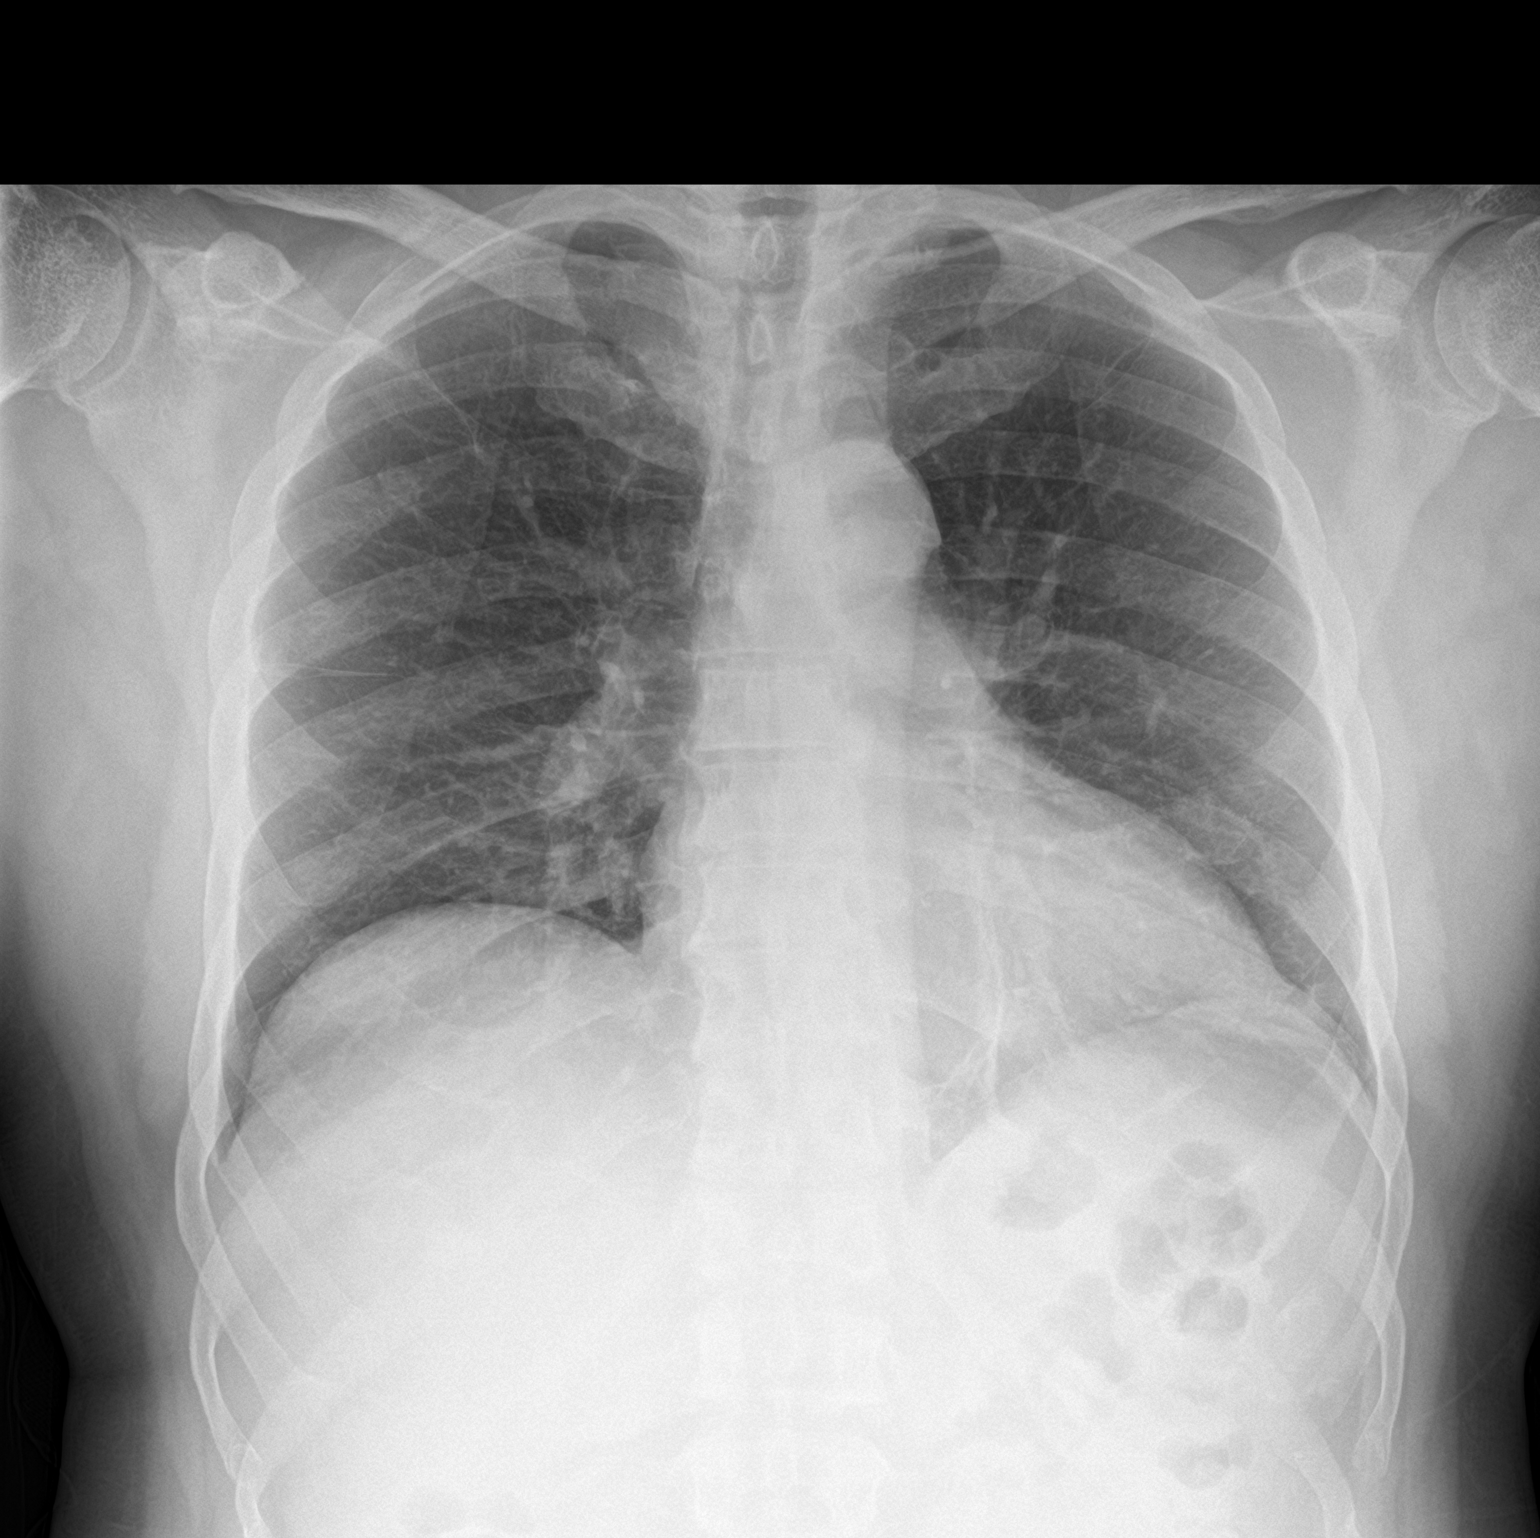

[chest lat]
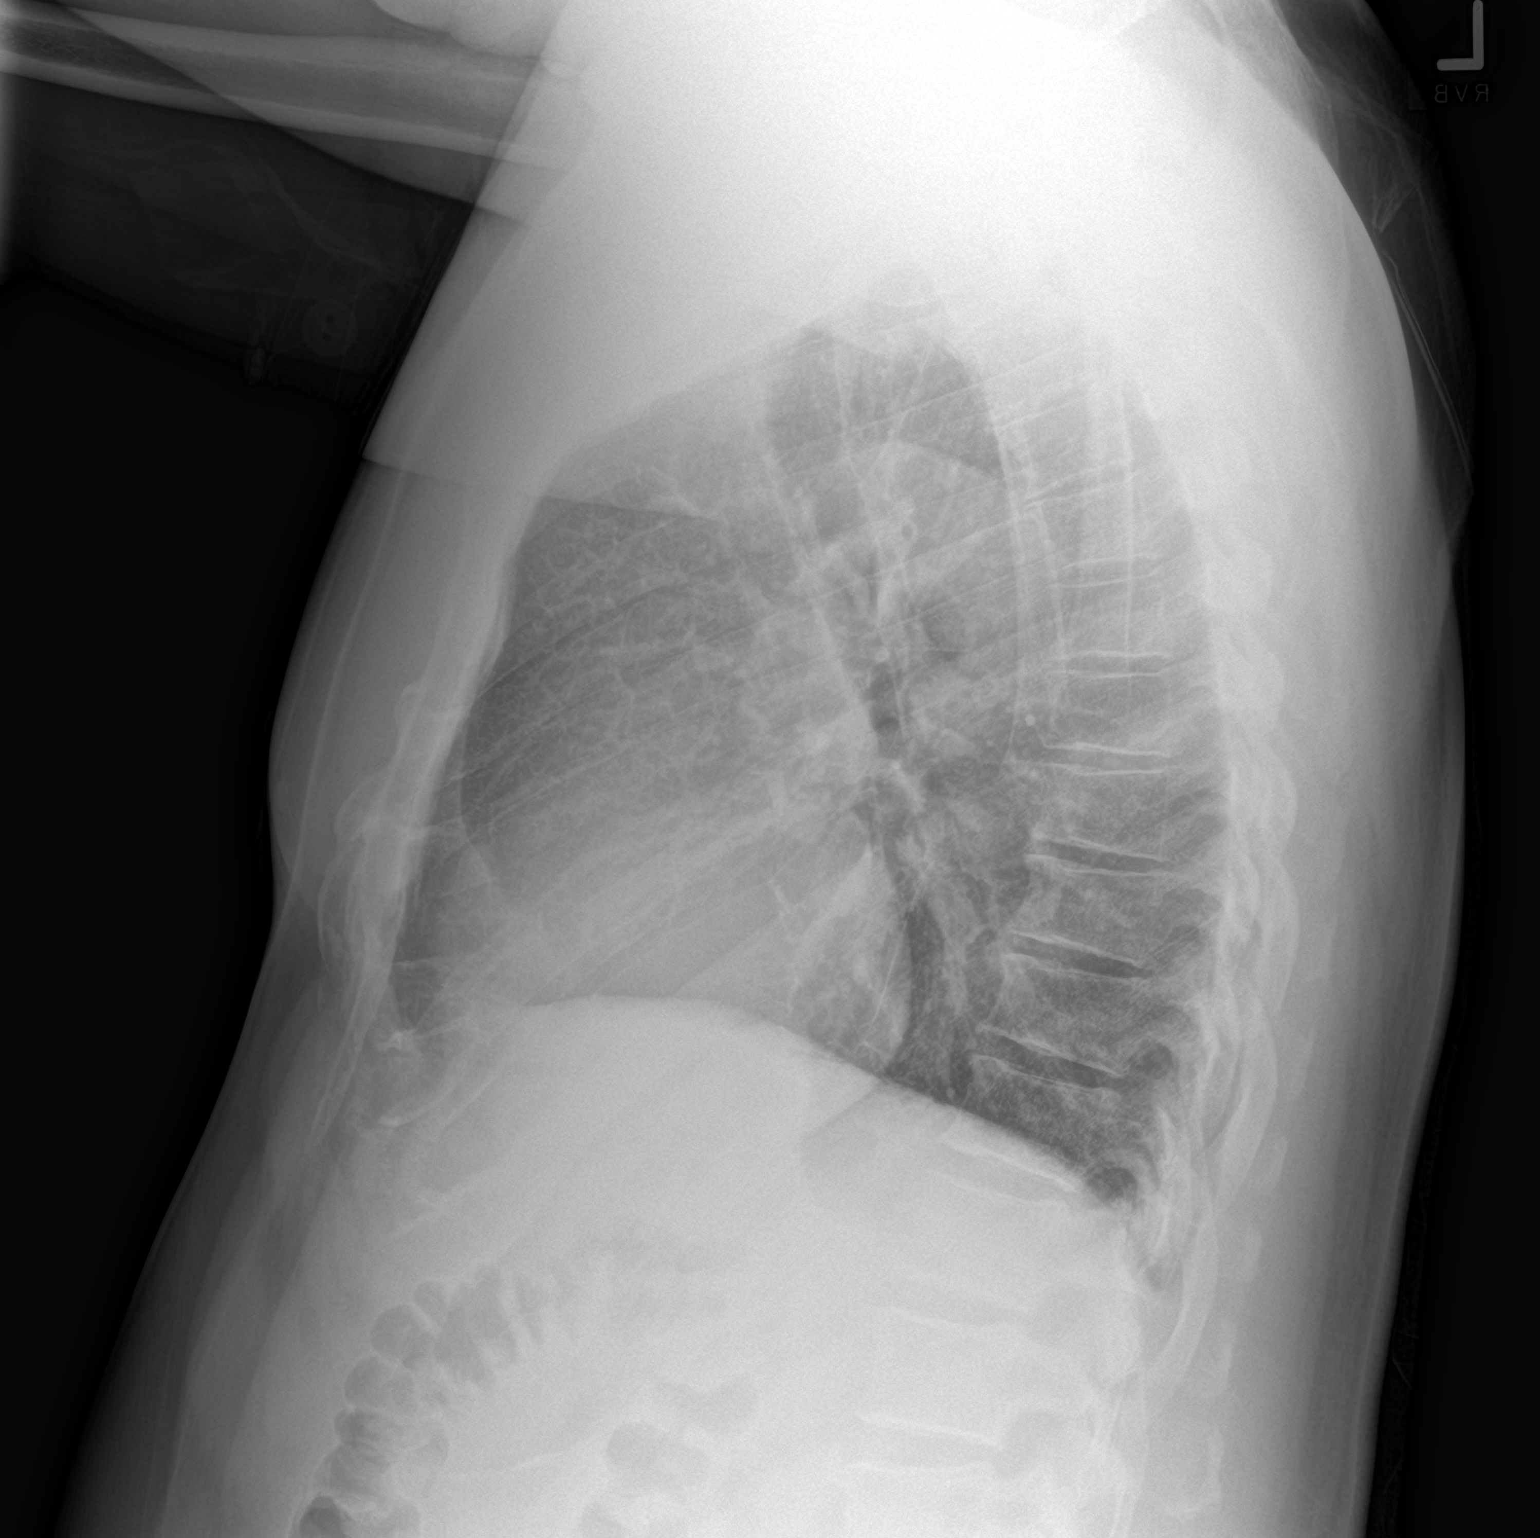

[2 of 2 positions shown; findings below may reference images not displayed]

FINDINGS: Similar low lung volumes with chronic left basilar bandlike
scarring. No focal pneumonia, collapse or consolidation. Negative
for edema, effusion or pneumothorax. Trachea midline. Right upper
lung 10 mm nodular opacity versus nodule between the second and
third anterior rib shadows and overlying the fifth posterior rib.
This may represent superimposed shadows versus a rib abnormality or
nodule. Finding is new compared to 4058. Recommend follow-up chest
CT for further evaluation.
IMPRESSION: 10 mm right upper lobe nodular opacity versus nodule. See above
comment and recommendation.

Otherwise stable low volume exam with left basilar scarring.

These results will be called to the ordering clinician or
representative by the Radiologist Assistant, and communication
documented in the PACS or [REDACTED].

## 2022-09-25 IMAGING — CT CT HEAD W/O CM
4 of 5 series · 15 of 47 positions shown, 17 images · non-contrast
Comparison: None.

CLINICAL DATA: Dizziness, head laceration.

EXAM:
CT HEAD WITHOUT CONTRAST
TECHNIQUE: Contiguous axial images were obtained from the base of the skull
through the vertex without intravenous contrast.

[Series 3: head wo · axial · 0.43mm/px · z∈[+1291,+1401]mm · 5 of 34 slices shown, 7 images (1 of 2)]
[im 6/34  brain]
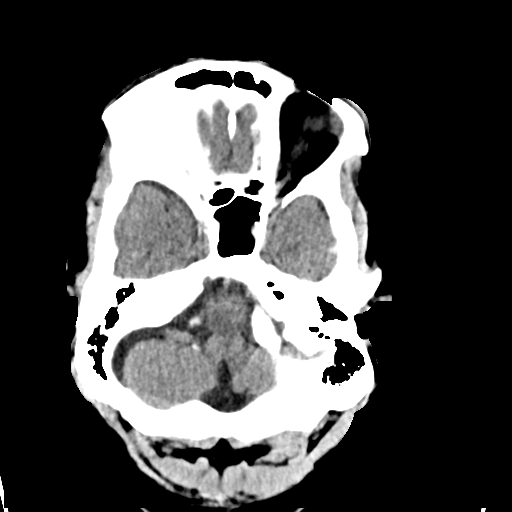
[im 6/34  bone]
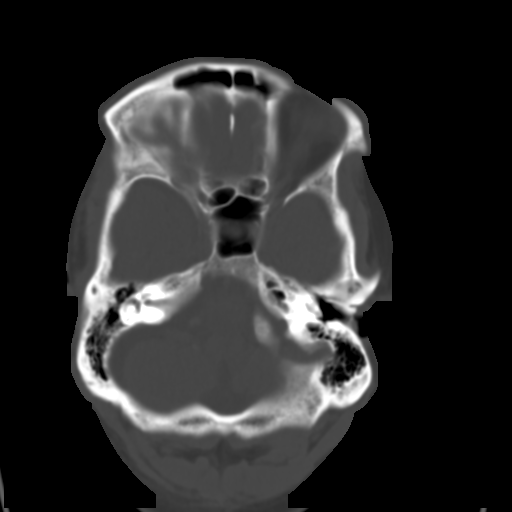
[im 12/34  brain]
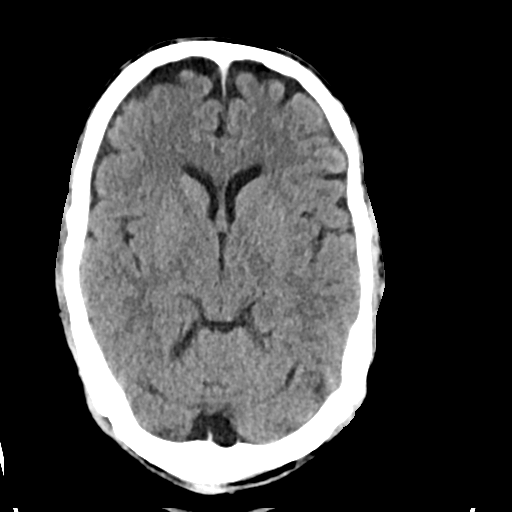
[im 17/34  brain]
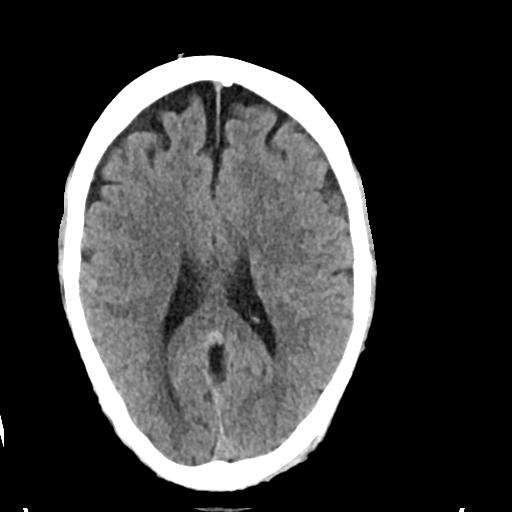
[im 23/34  brain]
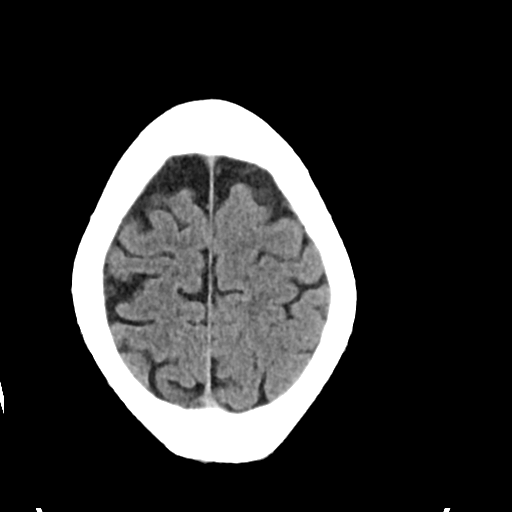
[im 28/34  brain]
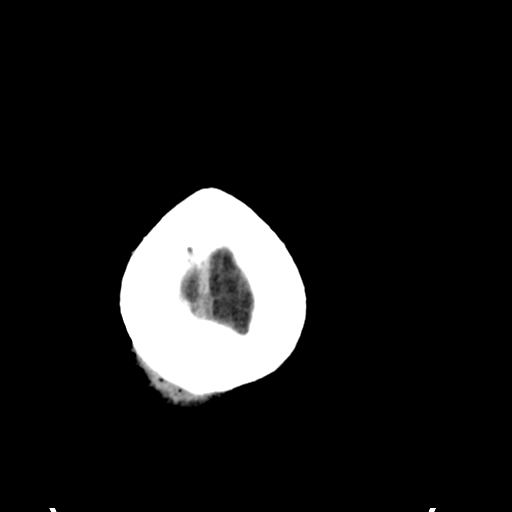
[im 28/34  bone]
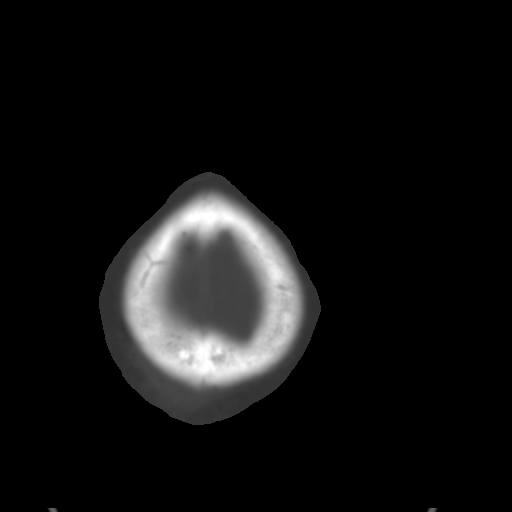

[Series 5: cor soft · coronal · 0.35mm/px · 3 of 70 slices shown]
[im 24/70  brain]
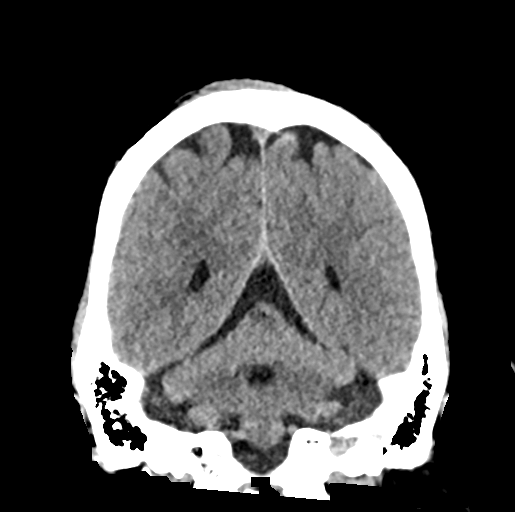
[im 31/70  brain]
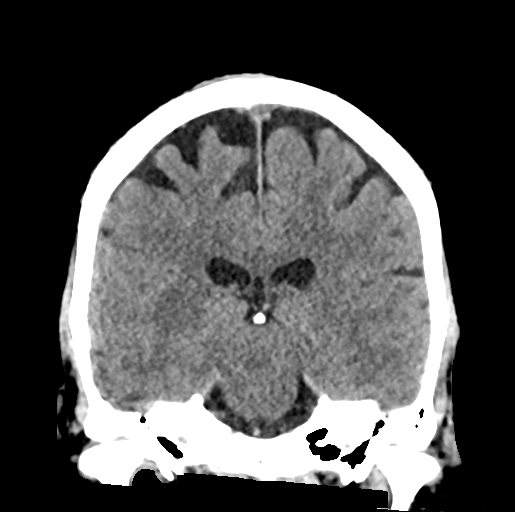
[im 39/70  brain]
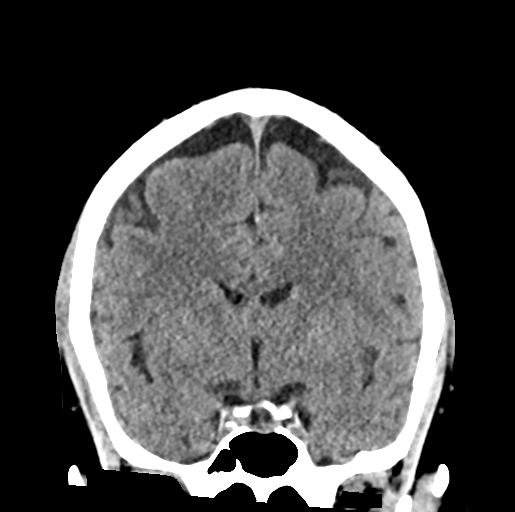

[Series 6: sag soft · sagittal · 0.35mm/px · 3 of 52 slices shown]
[im 18/52  brain]
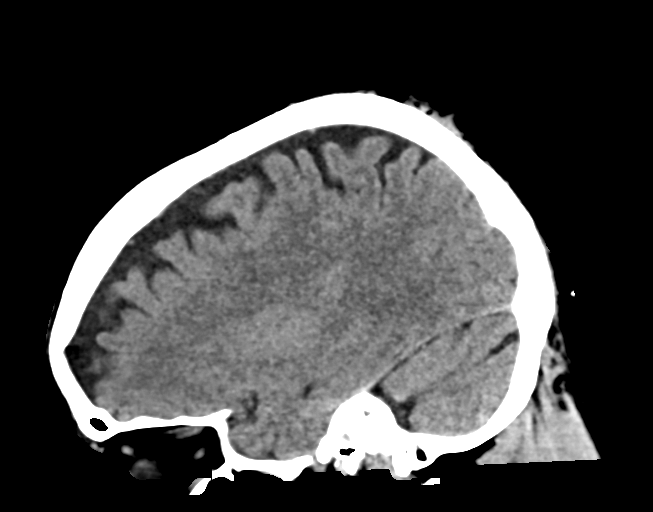
[im 26/52  brain]
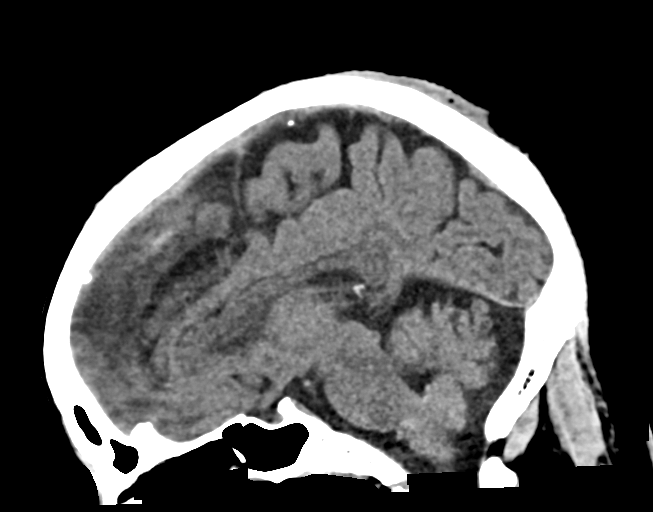
[im 35/52  brain]
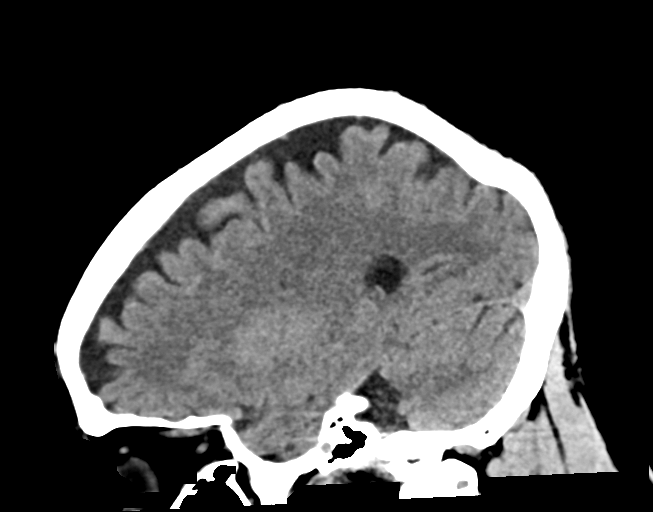

[Series 7: head wo · axial · 0.36mm/px · z∈[+1293,+1382]mm · 4 of 32 slices shown (2 of 2)]
[im 7/32  brain]
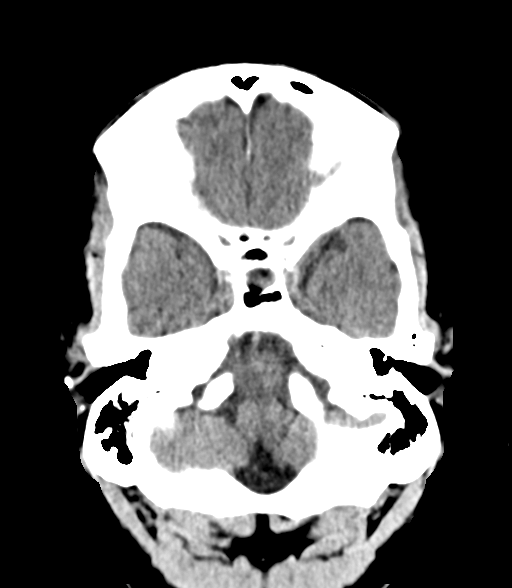
[im 13/32  brain]
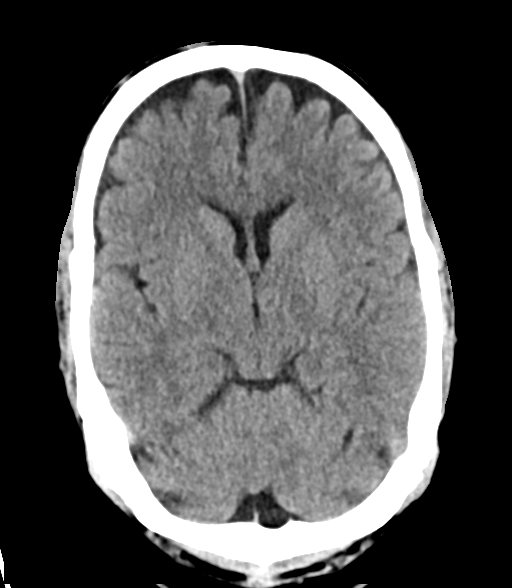
[im 19/32  brain]
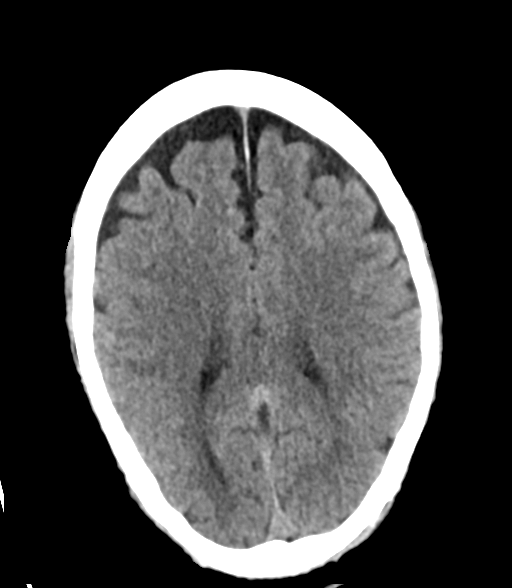
[im 25/32  brain]
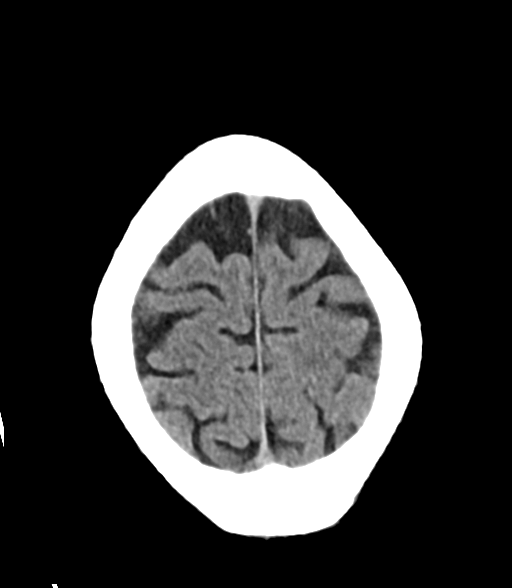

[15 of 47 positions shown; findings below may reference images not displayed]

FINDINGS: Brain: No evidence of acute infarction, hemorrhage, hydrocephalus,
extra-axial collection or mass lesion/mass effect.

Vascular: No hyperdense vessel or unexpected calcification.

Skull: Normal. Negative for fracture or focal lesion.

Sinuses/Orbits: No acute finding.

Other: Small posterior scalp hematoma is noted.
IMPRESSION: Small posterior scalp hematoma. No acute intracranial abnormality
seen.

## 2023-03-26 ENCOUNTER — Telehealth: Payer: Self-pay

## 2023-03-26 NOTE — Telephone Encounter (Signed)
11/5 received fax requested for DICOM records for this patient from [facility] of [place].  Records request given to [employer].
